# Patient Record
Sex: Female | Born: 1990 | Hispanic: Yes | Smoking: Never smoker
Health system: Southern US, Community
[De-identification: ages and names within clinical notes are randomized; demographics above are authoritative.]

## PROBLEM LIST (undated history)

## (undated) DIAGNOSIS — N189 Chronic kidney disease, unspecified: Secondary | ICD-10-CM

## (undated) DIAGNOSIS — Z789 Other specified health status: Secondary | ICD-10-CM

## (undated) DIAGNOSIS — I10 Essential (primary) hypertension: Secondary | ICD-10-CM

## (undated) HISTORY — DX: Other specified health status: Z78.9

## (undated) HISTORY — DX: Essential (primary) hypertension: I10

## (undated) HISTORY — DX: Chronic kidney disease, unspecified: N18.9

## (undated) HISTORY — PX: NO PAST SURGERIES: SHX2092

---

## 2021-02-16 ENCOUNTER — Emergency Department: Payer: Self-pay

## 2021-02-16 ENCOUNTER — Encounter: Payer: Self-pay | Admitting: Emergency Medicine

## 2021-02-16 ENCOUNTER — Other Ambulatory Visit: Payer: Self-pay

## 2021-02-16 ENCOUNTER — Emergency Department
Admission: EM | Admit: 2021-02-16 | Discharge: 2021-02-16 | Disposition: A | Discharge status: Home / Self Care | Payer: Self-pay | Attending: Emergency Medicine | Admitting: Emergency Medicine

## 2021-02-16 DIAGNOSIS — N760 Acute vaginitis: Secondary | ICD-10-CM | POA: Insufficient documentation

## 2021-02-16 DIAGNOSIS — R102 Pelvic and perineal pain: Secondary | ICD-10-CM

## 2021-02-16 DIAGNOSIS — N939 Abnormal uterine and vaginal bleeding, unspecified: Secondary | ICD-10-CM | POA: Insufficient documentation

## 2021-02-16 DIAGNOSIS — B9689 Other specified bacterial agents as the cause of diseases classified elsewhere: Secondary | ICD-10-CM

## 2021-02-16 LAB — WET PREP, GENITAL
Sperm: NONE SEEN
Trich, Wet Prep: NONE SEEN
Yeast Wet Prep HPF POC: NONE SEEN

## 2021-02-16 LAB — CHLAMYDIA/NGC RT PCR (ARMC ONLY)
Chlamydia Tr: NOT DETECTED
N gonorrhoeae: NOT DETECTED

## 2021-02-16 LAB — PREGNANCY, URINE: Preg Test, Ur: NEGATIVE

## 2021-02-16 MED ORDER — METRONIDAZOLE 500 MG PO TABS: 500.0000 mg | ORAL_TABLET | Freq: Two times a day (BID) | ORAL | 0 refills | Status: AC

## 2021-02-16 MED ORDER — METOCLOPRAMIDE HCL 5 MG PO TABS: 5.0000 mg | ORAL_TABLET | Freq: Three times a day (TID) | ORAL | 0 refills | Status: DC | PRN

## 2021-02-16 MED ORDER — METRONIDAZOLE 500 MG PO TABS
500.0000 mg | ORAL_TABLET | Freq: Once | ORAL | Status: AC
  Administered 2021-02-16: 500 mg via ORAL
  Filled 2021-02-16: qty 1

## 2021-02-16 MED ORDER — KETOROLAC TROMETHAMINE 30 MG/ML IJ SOLN
30.0000 mg | Freq: Once | INTRAMUSCULAR | Status: AC
  Administered 2021-02-16: 30 mg via INTRAMUSCULAR
  Filled 2021-02-16: qty 1

## 2021-02-16 MED ORDER — IBUPROFEN 800 MG PO TABS: 800.0000 mg | ORAL_TABLET | Freq: Three times a day (TID) | ORAL | 0 refills | Status: DC | PRN

## 2021-02-16 NOTE — ED Notes (Signed)
Pt has been complaining of vaginal bleeding for the past three days. Pt states that she also having pain in lower abd.

## 2021-02-16 NOTE — Discharge Instructions (Signed)
Your exam, labs, and ultrasound are all basically normal.  There is no indication of any serious cause of your pelvic pain including ovarian cyst, endometriosis, or fibroid tumors.  Your bleeding is due to the hormonal pill you took a few days earlier.  You expect to have continued bleeding for the next week.  Take the prescription medications as prescribed.  Follow-up with [place] health department or [facility] for ongoing symptoms peer return to the ED if needed.

## 2021-02-16 NOTE — ED Provider Notes (Signed)
[facility] Emergency Department Provider Note ____________________________________________  Time seen: 1811  I have reviewed the triage vital signs and the nursing notes.  HISTORY  Chief Complaint  Vaginal Bleeding   HPI Sherri Vasquez is a 30 y.o. female presents her self to the ED for evaluation of a 3-day complaint of vaginal bleeding. Patient reports he had a normal menstrual period on 2/14 - 02/10/2020. She describes an unprotected sexual encounter on Sunday, 2/20. She took the Plan B pill on Sunday, and began to experience heavy vaginal bleeding 3 days prior on 2/23. She reports severe menstrual cramps as well as large blood clots at this time. She has not taken any medicines for pain relief in the interim. She denies any fevers, chills, or sweats. She also denies any nausea, vomiting, diarrhea.  History reviewed. No pertinent past medical history.  There are no problems to display for this patient.   History reviewed. No pertinent surgical history.  Prior to Admission medications   Medication Sig Start Date End Date Taking? Authorizing Provider  ibuprofen (ADVIL) 800 MG tablet Take 1 tablet (800 mg total) by mouth every 8 (eight) hours as needed. 02/16/21  Yes Menshew, Charlesetta Ivory, PA-C  metoCLOPramide (REGLAN) 5 MG tablet Take 1 tablet (5 mg total) by mouth every 8 (eight) hours as needed for nausea or vomiting. 02/16/21  Yes Menshew, Charlesetta Ivory, PA-C  metroNIDAZOLE (FLAGYL) 500 MG tablet Take 1 tablet (500 mg total) by mouth 2 (two) times daily for 7 days. 02/16/21 02/23/21 Yes Menshew, Charlesetta Ivory, PA-C    Allergies Patient has no known allergies.  History reviewed. No pertinent family history.  Social History Social History   Tobacco Use  . Smoking status: Never Smoker  . Smokeless tobacco: Never Used  Substance Use Topics  . Alcohol use: Not Currently  . Drug use: Not Currently    Review of Systems  Constitutional: Negative  for fever. Eyes: Negative for visual changes. ENT: Negative for sore throat. Cardiovascular: Negative for chest pain. Respiratory: Negative for shortness of breath. Gastrointestinal: Negative for abdominal pain, vomiting and diarrhea. Genitourinary: Negative for dysuria. Reports menstrual cramps and vaginal bleeding as above. Musculoskeletal: Negative for back pain. Skin: Negative for rash. Neurological: Negative for headaches, focal weakness or numbness. ____________________________________________  PHYSICAL EXAM:  VITAL SIGNS: ED Triage Vitals [02/16/21 1710]  Enc Vitals Group     BP 130/87     Pulse Rate 76     Resp 18     Temp 98.7 F (37.1 C)     Temp Source Oral     SpO2 98 %     Weight 135 lb (61.2 kg)     Height 5\' 1"  (1.549 m)     Head Circumference      Peak Flow      Pain Score 9     Pain Loc      Pain Edu?      Excl. in GC?     Constitutional: Alert and oriented. Well appearing and in no distress. Head: Normocephalic and atraumatic. Eyes: Conjunctivae are normal. Normal extraocular movements Mouth/Throat: Mucous membranes are moist. Cardiovascular: Normal rate, regular rhythm. Normal distal pulses. Respiratory: Normal respiratory effort. No wheezes/rales/rhonchi. Gastrointestinal: Soft and nontender. No distention. GU: Normal external genitalia.  Dark red blood noted in the vault.  No cervical motion tenderness or adnexal masses appreciated. Musculoskeletal: Nontender with normal range of motion in all extremities.  Neurologic:  Normal gait without ataxia.  Normal speech and language. No gross focal neurologic deficits are appreciated. Skin:  Skin is warm, dry and intact. No rash noted. Psychiatric: Mood and affect are normal. Patient exhibits appropriate insight and judgment. ____________________________________________   LABS (pertinent positives/negatives) Labs Reviewed  WET PREP, GENITAL - Abnormal; Notable for the following components:      Result  Value   Clue Cells Wet Prep HPF POC PRESENT (*)    WBC, Wet Prep HPF POC FEW (*)    All other components within normal limits  CHLAMYDIA/NGC RT PCR ([facility] ONLY)  PREGNANCY, URINE  ____________________________________________   RADIOLOGY  US Pelvic Complete w/ Transvaginal r/o Torsion  IMPRESSION: Normal pelvic ultrasound. No evidence of adnexal mass or ovarian torsion. The endometrium appears normal. ____________________________________________  PROCEDURES  Toradol 30 mg IM Metronidazole 500 mg p.o.  Procedures ____________________________________________  INITIAL IMPRESSION / ASSESSMENT AND PLAN / ED COURSE  Differential diagnosis includes, but is not limited to, ovarian cyst, ovarian torsion, acute appendicitis, diverticulitis, urinary tract infection/pyelonephritis, endometriosis, bowel obstruction, colitis, renal colic, gastroenteritis, hernia, fibroids, pregnancy related pain including ectopic pregnancy, etc.  Female patient with ED evaluation of 3 days of vaginal bleeding and cramping.  Patient admittedly took a Plan B tablet the same day as and on protected sexual encounter.  She began experience bleeding and cramping 3 days later.  Her exam is overall benign reassurance time.  Concern was for possible ovarian cyst or torsion, complicating her picture.  She was found to have a normal pelvic ultrasound exam.  Her wet prep did reveal clue cells and she will be treated empirically with metronidazole.  Patient clinical picture at this point likely is consistent with vaginal bleeding and cramping secondary to her emergency contraception.  Patient is advised that she may experience bleeding for another week.  She is to take the prescription ibuprofen as directed.  She is also to take metronidazole as as prescribed.  She will follow with primary provider, local urgent care, or an OB provider for ongoing symptoms.  Return precautions have been discussed.   Sherri Vasquez  was evaluated in Emergency Department on 02/16/2021 for the symptoms described in the history of present illness. She was evaluated in the context of the global COVID-19 pandemic, which necessitated consideration that the patient might be at risk for infection with the SARS-CoV-2 virus that causes COVID-19. Institutional protocols and algorithms that pertain to the evaluation of patients at risk for COVID-19 are in a state of rapid change based on information released by regulatory bodies including the CDC and federal and state organizations. These policies and algorithms were followed during the patient's care in the ED. ____________________________________________  FINAL CLINICAL IMPRESSION(S) / ED DIAGNOSES  Final diagnoses:  Abnormal vaginal bleeding  Pelvic pain  BV (bacterial vaginosis)      Karmen Stabs, Charlesetta Ivory, PA-C 02/16/21 2116    Phineas Semen, MD 02/16/21 2150

## 2021-02-16 NOTE — ED Notes (Signed)
U bag placed on this patient at this time

## 2021-02-16 NOTE — ED Triage Notes (Signed)
Pt comes into the ED via POV c/o vaginal bleeding.  Pt states she had her menstruation on 02/06/20 it stopped, she had intercourse, took the plan B pill, and now she has started bleeding again that started 3 days ago.  Pt states she is passing blood clots and has severe cramps.  Pt ambulatory to triage and in NAD at this time.

## 2021-03-05 ENCOUNTER — Other Ambulatory Visit: Payer: Self-pay

## 2021-03-05 ENCOUNTER — Encounter: Payer: Self-pay | Admitting: Radiology

## 2021-03-05 ENCOUNTER — Emergency Department: Payer: Self-pay

## 2021-03-05 ENCOUNTER — Emergency Department
Admission: EM | Admit: 2021-03-05 | Discharge: 2021-03-05 | Disposition: A | Discharge status: Home / Self Care | Payer: Self-pay | Attending: Emergency Medicine | Admitting: Emergency Medicine

## 2021-03-05 DIAGNOSIS — W19XXXA Unspecified fall, initial encounter: Secondary | ICD-10-CM | POA: Insufficient documentation

## 2021-03-05 DIAGNOSIS — S82832A Other fracture of upper and lower end of left fibula, initial encounter for closed fracture: Secondary | ICD-10-CM | POA: Insufficient documentation

## 2021-03-05 DIAGNOSIS — M25572 Pain in left ankle and joints of left foot: Secondary | ICD-10-CM

## 2021-03-05 DIAGNOSIS — Y9366 Activity, soccer: Secondary | ICD-10-CM | POA: Insufficient documentation

## 2021-03-05 MED ORDER — HYDROCODONE-ACETAMINOPHEN 5-325 MG PO TABS: 1.0000 | ORAL_TABLET | ORAL | 0 refills | Status: DC | PRN

## 2021-03-05 MED ORDER — OXYCODONE-ACETAMINOPHEN 5-325 MG PO TABS
1.0000 | ORAL_TABLET | ORAL | Status: AC | PRN
  Administered 2021-03-05 (×2): 1 via ORAL
  Filled 2021-03-05 (×2): qty 1

## 2021-03-05 NOTE — ED Provider Notes (Signed)
[facility] Emergency Department Provider Note   ____________________________________________   Event Date/Time   First MD Initiated Contact with Patient 03/05/21 [phone]     (approximate)  I have reviewed the triage vital signs and the nursing notes.   HISTORY  Chief Complaint Ankle Injury    HPI Sherri Vasquez is a 30 y.o. female with no stated past medical history presents after an injury to the left ankle yesterday while playing soccer.  Patient states that she fell onto the lateral aspect of her left ankle and has had 9/10, aching/throbbing pain that radiates up the lateral aspect of her left leg.  Patient states that ambulation and palpation over the lateral aspect of the ankle increases this pain.  Patient denies any specific alleviating factors however she states that with it elevated the pain is somewhat relieved.  Patient denies any other injuries, loss of consciousness, or decreased range of motion at this joint         History reviewed. No pertinent past medical history.  There are no problems to display for this patient.   No past surgical history on file.  Prior to Admission medications   Medication Sig Start Date End Date Taking? Authorizing Provider  HYDROcodone-acetaminophen (NORCO) 5-325 MG tablet Take 1 tablet by mouth every 4 (four) hours as needed for moderate pain. 03/05/21  Yes Merwyn Katos, MD  ibuprofen (ADVIL) 800 MG tablet Take 1 tablet (800 mg total) by mouth every 8 (eight) hours as needed. 02/16/21   Menshew, Charlesetta Ivory, PA-C  metoCLOPramide (REGLAN) 5 MG tablet Take 1 tablet (5 mg total) by mouth every 8 (eight) hours as needed for nausea or vomiting. 02/16/21   Menshew, Charlesetta Ivory, PA-C    Allergies Patient has no known allergies.  No family history on file.  Social History Social History   Tobacco Use  . Smoking status: Never Smoker  . Smokeless tobacco: Never Used  Substance Use Topics  .  Alcohol use: Not Currently  . Drug use: Not Currently    Review of Systems Constitutional: No fever/chills Eyes: No visual changes. ENT: No sore throat. Cardiovascular: Denies chest pain. Respiratory: Denies shortness of breath. Gastrointestinal: No abdominal pain.  No nausea, no vomiting.  No diarrhea. Genitourinary: Negative for dysuria. Musculoskeletal: Endorses acute left ankle pain Skin: Negative for rash. Neurological: Negative for headaches, weakness/numbness/paresthesias in any extremity Psychiatric: Negative for suicidal ideation/homicidal ideation   ____________________________________________   PHYSICAL EXAM:  VITAL SIGNS: ED Triage Vitals  Enc Vitals Group     BP 03/05/21 0211 (!) 126/91     Pulse Rate 03/05/21 0211 81     Resp 03/05/21 0211 16     Temp 03/05/21 0211 97.9 F (36.6 C)     Temp Source 03/05/21 0211 Oral     SpO2 03/05/21 0211 98 %     Weight 03/05/21 0208 137 lb (62.1 kg)     Height 03/05/21 0208 5\' 1"  (1.549 m)     Head Circumference --      Peak Flow --      Pain Score 03/05/21 0208 10     Pain Loc --      Pain Edu? --      Excl. in GC? --    Constitutional: Alert and oriented. Well appearing and in no acute distress. Eyes: Conjunctivae are normal. PERRL. Head: Atraumatic. Nose: No congestion/rhinnorhea. Mouth/Throat: Mucous membranes are moist. Neck: No stridor Cardiovascular: Grossly normal heart sounds.  Good peripheral circulation. Respiratory: Normal respiratory effort.  No retractions. Gastrointestinal: Soft and nontender. No distention. Musculoskeletal: Swelling and tenderness to palpation in the left ankle with limited range of motion secondary to pain Neurologic:  Normal speech and language. No gross focal neurologic deficits are appreciated. Skin:  Skin is warm and dry. No rash noted. Psychiatric: Mood and affect are normal. Speech and behavior are normal.  ____________________________________________   LABS (all labs  ordered are listed, but only abnormal results are displayed)  Labs Reviewed - No data to display ____________________________________________  EKG  RADIOLOGY  ED MD interpretation: Three-view x-ray of the left ankle shows a nondisplaced distal fibular fracture  Official radiology report(s): DG Ankle Complete Left  Result Date: 03/05/2021 CLINICAL DATA:  Ankle injury EXAM: LEFT ANKLE COMPLETE - 3+ VIEW COMPARISON:  None. FINDINGS: Acute nondisplaced fracture involves the distal shaft and metaphysis of the fibula. Moderate soft tissue swelling. Minimal widening medial mortise. IMPRESSION: Acute nondisplaced distal fibular fracture. Electronically Signed   By: Jasmine Pang M.D.   On: 03/05/2021 03:23    ____________________________________________   PROCEDURES  Procedure(s) performed (including Critical Care):  Procedures   ____________________________________________   INITIAL IMPRESSION / ASSESSMENT AND PLAN / ED COURSE  As part of my medical decision making, I reviewed the following data within the electronic MEDICAL RECORD NUMBER Nursing notes reviewed and incorporated, Old chart reviewed, Radiograph reviewed and Notes from prior ED visits reviewed and incorporated      Workup: XR Ankle Findings: Distal left fibular fracture  Patient does not currently demonstrate complications of fracture such as compartment syndrome, arterial or nerve injury. Interventions: The fracture has been satisfactorily immobilized, and the patient has been given appropriate analgesia.  Disposition: Discharge with strict return precautions and instructions to follow up with primary MD within 24-48 hours for further evaluation including referral to an orthopedist or podiatrist.      ____________________________________________   FINAL CLINICAL IMPRESSION(S) / ED DIAGNOSES  Final diagnoses:  Other closed fracture of distal end of left fibula, initial encounter  Acute left ankle pain      ED Discharge Orders         Ordered    HYDROcodone-acetaminophen (NORCO) 5-325 MG tablet  Every 4 hours PRN        03/05/21 0404           Note:  This document was prepared using Dragon voice recognition software and may include unintentional dictation errors.   Merwyn Katos, MD 03/05/21 [phone]

## 2021-03-05 NOTE — ED Notes (Signed)
Pt agreeable with d/c plan as discussed by Dr Vicente Males- this nurse has verbally reinforced d/c instructions and provided pt with written copy - strongly educated pt on importance of following up with ortho to maximize healing - pt acknowledges verbal understanding and denies any additional questions, concerns, needs.   Escorted to lobby to await ride (brother will be taking pt home) -- lobby staff first nurse April, RN made aware of pt status/location

## 2021-03-05 NOTE — ED Triage Notes (Addendum)
Pt states was playing soccer earlier yesterday when she injured left ankle. Pt drove self to ed and was escorted from parking lot by this rn into wheelchair. Pt states pain is severe. Able to move toes.

## 2021-03-07 NOTE — ED Notes (Signed)
[facility] and [facility] not [facility] norco.  So she has not gotten her med yet.  I called the [facility] and at first they said they did not have any. They are unable to transfer the rx to [facility] either.  They then said that the rx was for only 6 pills and they think they have 4.  She checked and she has 4, so she can fill the med for 4 pills now.  She is going to contact the patient and inform her.

## 2021-04-04 ENCOUNTER — Other Ambulatory Visit: Payer: Self-pay

## 2021-04-04 ENCOUNTER — Ambulatory Visit (LOCAL_COMMUNITY_HEALTH_CENTER): Payer: Self-pay

## 2021-04-04 VITALS — BP 130/86 | Ht 61.0 in | Wt 140.5 lb

## 2021-04-04 DIAGNOSIS — Z3201 Encounter for pregnancy test, result positive: Secondary | ICD-10-CM

## 2021-04-04 MED ORDER — PRENATAL 27-0.8 MG PO TABS: 1.0000 | ORAL_TABLET | Freq: Every day | ORAL | 0 refills | Status: AC

## 2021-04-04 NOTE — Progress Notes (Addendum)
UPT positive. Plans prenatal care at [facility]. LMP 02/05/2021. No [facility] on file. To clerk for preadmit. Jerel Shepherd, RN

## 2021-04-05 LAB — PREGNANCY, URINE: Preg Test, Ur: POSITIVE — AB

## 2021-05-01 ENCOUNTER — Other Ambulatory Visit: Payer: Self-pay

## 2021-05-01 ENCOUNTER — Encounter: Payer: Self-pay | Admitting: Advanced Practice Midwife

## 2021-05-01 ENCOUNTER — Ambulatory Visit: Payer: Medicaid Other | Admitting: Advanced Practice Midwife

## 2021-05-01 DIAGNOSIS — O0991 Supervision of high risk pregnancy, unspecified, first trimester: Secondary | ICD-10-CM | POA: Diagnosis not present

## 2021-05-01 DIAGNOSIS — Z3403 Encounter for supervision of normal first pregnancy, third trimester: Secondary | ICD-10-CM | POA: Insufficient documentation

## 2021-05-01 DIAGNOSIS — Z641 Problems related to multiparity: Secondary | ICD-10-CM

## 2021-05-01 DIAGNOSIS — Z23 Encounter for immunization: Secondary | ICD-10-CM

## 2021-05-01 DIAGNOSIS — Z789 Other specified health status: Secondary | ICD-10-CM

## 2021-05-01 DIAGNOSIS — O161 Unspecified maternal hypertension, first trimester: Secondary | ICD-10-CM | POA: Diagnosis not present

## 2021-05-01 LAB — URINALYSIS
Bilirubin, UA: NEGATIVE
Glucose, UA: NEGATIVE
Ketones, UA: NEGATIVE
Nitrite, UA: POSITIVE — AB
Protein,UA: NEGATIVE
Specific Gravity, UA: 1.025 (ref 1.005–1.030)
Urobilinogen, Ur: 0.2 mg/dL (ref 0.2–1.0)
pH, UA: 6 (ref 5.0–7.5)

## 2021-05-01 LAB — WET PREP FOR TRICH, YEAST, CLUE
Trichomonas Exam: NEGATIVE
Yeast Exam: NEGATIVE

## 2021-05-01 LAB — HEMOGLOBIN, FINGERSTICK: Hemoglobin: 12.5 g/dL (ref 11.1–15.9)

## 2021-05-01 LAB — PREGNANCY, URINE: Preg Test, Ur: POSITIVE — AB
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
## 2021-05-01 NOTE — Progress Notes (Addendum)
Here today for 12.1 week MH IP. Taking PNV QD. Denies ED/hospital visits since +PT.  Relocated to [place] from [place] 08/2020. Accepts Flu vaccine. Tawny Hopping, RN

## 2021-05-01 NOTE — Progress Notes (Addendum)
UA, Hgb and [employer] results reviewed. Per standing orders no treatment indicated. RN informed patient she was + for urine nitrates and may have a UTI but will verify with pending urine C&S results. Patient verbalized understanding. Tawny Hopping, RN

## 2021-05-02 ENCOUNTER — Other Ambulatory Visit: Payer: Self-pay

## 2021-05-02 ENCOUNTER — Inpatient Hospital Stay
Admission: EM | Admit: 2021-05-02 | Discharge: 2021-05-06 | DRG: 832 | Disposition: A | Discharge status: Home / Self Care | Payer: Medicaid Other | Attending: Family Medicine | Admitting: Family Medicine

## 2021-05-02 ENCOUNTER — Encounter: Payer: Self-pay | Admitting: Emergency Medicine

## 2021-05-02 ENCOUNTER — Telehealth: Payer: Self-pay

## 2021-05-02 ENCOUNTER — Emergency Department: Payer: Medicaid Other

## 2021-05-02 DIAGNOSIS — O26893 Other specified pregnancy related conditions, third trimester: Secondary | ICD-10-CM | POA: Diagnosis present

## 2021-05-02 DIAGNOSIS — O26891 Other specified pregnancy related conditions, first trimester: Secondary | ICD-10-CM

## 2021-05-02 DIAGNOSIS — O26831 Pregnancy related renal disease, first trimester: Secondary | ICD-10-CM | POA: Diagnosis present

## 2021-05-02 DIAGNOSIS — R109 Unspecified abdominal pain: Secondary | ICD-10-CM

## 2021-05-02 DIAGNOSIS — Z3A12 12 weeks gestation of pregnancy: Secondary | ICD-10-CM | POA: Diagnosis not present

## 2021-05-02 DIAGNOSIS — O2301 Infections of kidney in pregnancy, first trimester: Principal | ICD-10-CM | POA: Diagnosis present

## 2021-05-02 DIAGNOSIS — R030 Elevated blood-pressure reading, without diagnosis of hypertension: Secondary | ICD-10-CM | POA: Diagnosis present

## 2021-05-02 DIAGNOSIS — N10 Acute tubulo-interstitial nephritis: Secondary | ICD-10-CM | POA: Diagnosis present

## 2021-05-02 DIAGNOSIS — O99411 Diseases of the circulatory system complicating pregnancy, first trimester: Secondary | ICD-10-CM | POA: Diagnosis present

## 2021-05-02 DIAGNOSIS — Z20822 Contact with and (suspected) exposure to covid-19: Secondary | ICD-10-CM | POA: Diagnosis present

## 2021-05-02 DIAGNOSIS — O0941 Supervision of pregnancy with grand multiparity, first trimester: Secondary | ICD-10-CM

## 2021-05-02 DIAGNOSIS — R000 Tachycardia, unspecified: Secondary | ICD-10-CM | POA: Diagnosis present

## 2021-05-02 DIAGNOSIS — N200 Calculus of kidney: Secondary | ICD-10-CM

## 2021-05-02 DIAGNOSIS — B9620 Unspecified Escherichia coli [E. coli] as the cause of diseases classified elsewhere: Secondary | ICD-10-CM | POA: Diagnosis present

## 2021-05-02 DIAGNOSIS — R1032 Left lower quadrant pain: Secondary | ICD-10-CM | POA: Diagnosis present

## 2021-05-02 DIAGNOSIS — Z3491 Encounter for supervision of normal pregnancy, unspecified, first trimester: Secondary | ICD-10-CM

## 2021-05-02 LAB — BASIC METABOLIC PANEL
Anion gap: 10 (ref 5–15)
BUN: 6 mg/dL (ref 6–20)
CO2: 21 mmol/L — ABNORMAL LOW (ref 22–32)
Calcium: 9.3 mg/dL (ref 8.9–10.3)
Chloride: 105 mmol/L (ref 98–111)
Creatinine, Ser: 0.55 mg/dL (ref 0.44–1.00)
GFR, Estimated: >60 mL/min (ref >60)
Glucose, Bld: 117 mg/dL — ABNORMAL HIGH (ref 70–99)
Potassium: 3.5 mmol/L (ref 3.5–5.1)
Sodium: 136 mmol/L (ref 135–145)

## 2021-05-02 LAB — CBC
HCT: 36.1 % (ref 36.0–46.0)
Hemoglobin: 12.6 g/dL (ref 12.0–15.0)
MCH: 30.5 pg (ref 26.0–34.0)
MCHC: 34.9 g/dL (ref 30.0–36.0)
MCV: 87.4 fL (ref 80.0–100.0)
Platelets: 296 10*3/uL (ref 150–400)
RBC: 4.13 MIL/uL (ref 3.87–5.11)
RDW: 12.5 % (ref 11.5–15.5)
WBC: 11.4 10*3/uL — ABNORMAL HIGH (ref 4.0–10.5)
nRBC: 0 % (ref 0.0–0.2)

## 2021-05-02 LAB — URINALYSIS, COMPLETE (UACMP) WITH MICROSCOPIC
Bilirubin Urine: NEGATIVE
Glucose, UA: NEGATIVE mg/dL
Ketones, ur: 5 mg/dL — AB
Nitrite: NEGATIVE
Protein, ur: 30 mg/dL — AB
RBC / HPF: >50 RBC/hpf — ABNORMAL HIGH (ref 0–5)
Specific Gravity, Urine: 1.012 (ref 1.005–1.030)
WBC, UA: >50 WBC/hpf — ABNORMAL HIGH (ref 0–5)
pH: 6 (ref 5.0–8.0)

## 2021-05-02 LAB — CHLAMYDIA/NGC RT PCR (ARMC ONLY)
Chlamydia Tr: NOT DETECTED
N gonorrhoeae: NOT DETECTED

## 2021-05-02 LAB — POC URINE PREG, ED: Preg Test, Ur: POSITIVE — AB

## 2021-05-02 LAB — RESP PANEL BY RT-PCR (FLU A&B, COVID) ARPGX2
Influenza A by PCR: NEGATIVE
Influenza B by PCR: NEGATIVE
SARS Coronavirus 2 by RT PCR: NEGATIVE

## 2021-05-02 LAB — WET PREP, GENITAL
Clue Cells Wet Prep HPF POC: NONE SEEN
Sperm: NONE SEEN
Trich, Wet Prep: NONE SEEN
Yeast Wet Prep HPF POC: NONE SEEN

## 2021-05-02 LAB — HCG, QUANTITATIVE, PREGNANCY: hCG, Beta Chain, Quant, S: 92367 m[IU]/mL — ABNORMAL HIGH (ref <5)

## 2021-05-02 LAB — ABO/RH: ABO/RH(D): O POS

## 2021-05-02 LAB — LEAD, BLOOD (ADULT >= 16 YRS): Lead-Whole Blood: <1 ug/dL (ref 0–4)

## 2021-05-02 LAB — HGB A1C W/O EAG: Hgb A1c MFr Bld: 5.3 % (ref 4.8–5.6)

## 2021-05-02 MED ORDER — MORPHINE SULFATE (PF) 2 MG/ML IV SOLN
2.0000 mg | INTRAVENOUS | Status: DC | PRN
  Administered 2021-05-02 – 2021-05-03 (×4): 2 mg via INTRAVENOUS
  Filled 2021-05-02 (×4): qty 1

## 2021-05-02 MED ORDER — ONDANSETRON HCL 4 MG/2ML IJ SOLN
4.0000 mg | Freq: Once | INTRAMUSCULAR | Status: AC
  Administered 2021-05-02: 4 mg via INTRAVENOUS
  Filled 2021-05-02: qty 2

## 2021-05-02 MED ORDER — HYDROMORPHONE HCL 1 MG/ML IJ SOLN
INTRAMUSCULAR | Status: AC
  Filled 2021-05-02: qty 1

## 2021-05-02 MED ORDER — ONDANSETRON HCL 4 MG PO TABS
4.0000 mg | ORAL_TABLET | Freq: Four times a day (QID) | ORAL | Status: DC | PRN
  Administered 2021-05-03: 4 mg via ORAL
  Filled 2021-05-02: qty 1

## 2021-05-02 MED ORDER — SODIUM CHLORIDE 0.9 % IV BOLUS
1000.0000 mL | Freq: Once | INTRAVENOUS | Status: AC
  Administered 2021-05-02: 1000 mL via INTRAVENOUS

## 2021-05-02 MED ORDER — HYDROMORPHONE HCL 1 MG/ML IJ SOLN
0.5000 mg | Freq: Once | INTRAMUSCULAR | Status: AC
  Administered 2021-05-02: 0.5 mg via INTRAVENOUS

## 2021-05-02 MED ORDER — ACETAMINOPHEN 325 MG PO TABS
650.0000 mg | ORAL_TABLET | Freq: Four times a day (QID) | ORAL | Status: DC | PRN
  Administered 2021-05-02 – 2021-05-05 (×6): 650 mg via ORAL
  Filled 2021-05-02 (×6): qty 2

## 2021-05-02 MED ORDER — MORPHINE SULFATE (PF) 4 MG/ML IV SOLN
4.0000 mg | Freq: Once | INTRAVENOUS | Status: AC
  Administered 2021-05-02: 4 mg via INTRAVENOUS
  Filled 2021-05-02: qty 1

## 2021-05-02 MED ORDER — ACETAMINOPHEN 650 MG RE SUPP: 650.0000 mg | Freq: Four times a day (QID) | RECTAL | Status: DC | PRN

## 2021-05-02 MED ORDER — ONDANSETRON HCL 4 MG/2ML IJ SOLN
4.0000 mg | Freq: Four times a day (QID) | INTRAMUSCULAR | Status: DC | PRN
  Administered 2021-05-03 – 2021-05-04 (×3): 4 mg via INTRAVENOUS
  Filled 2021-05-02 (×3): qty 2

## 2021-05-02 MED ORDER — SODIUM CHLORIDE 0.9 % IV SOLN
1.0000 g | Freq: Once | INTRAVENOUS | Status: AC
  Administered 2021-05-02: 1 g via INTRAVENOUS
  Filled 2021-05-02: qty 10

## 2021-05-02 NOTE — ED Notes (Signed)
Patient transported to Ultrasound 

## 2021-05-02 NOTE — ED Provider Notes (Signed)
[facility] Emergency Department Provider Note   ____________________________________________   Event Date/Time   First MD Initiated Contact with Patient 05/02/21 1636     (approximate)  I have reviewed the triage vital signs and the nursing notes.   HISTORY  Chief Complaint Flank Pain    HPI Sherri Vasquez is a 30 y.o. female, G7P6006 at approximately 12 weeks of pregnancy presents to the ED complaining of flank pain.  Patient reports that she developed some pain in the left lower quadrant of her abdomen yesterday.  Pain has been waxing and waning but constant since onset, described as sharp and severe at this moment.  It has been associated with nausea and a couple episodes of vomiting, she denies any changes in her bowel movements.  She has not noticed any dysuria or hematuria, denies any fevers.  She describes current symptoms as similar to prior kidney stones.  She recently had a positive pregnancy test at her PCPs office 2 days ago, states her LMP was February 12th.  She has not yet had an ultrasound this pregnancy or established with OB/GYN.  She had some light vaginal spotting 3 days ago that has since resolved and she denies any pelvic pain or discharge.        Past Medical History:  Diagnosis Date  . Chronic kidney disease    2019 kidney stones during preg  . Hypertension   . Patient denies medical problems     Patient Active Problem List   Diagnosis Date Noted  . Acute pyelonephritis 05/02/2021  . Nephrolithiasis 05/02/2021  . First trimester pregnancy 05/02/2021  . Low birth weight infant x2 at term (06/09/2015 5 lbs, 03/24/2011 5 lbs) 05/01/2021  . Grand multipara G7P6 05/01/2021  . Supervision of high risk pregnancy in first trimester 05/01/2021  . Elevated blood pressure affecting pregnancy in first trimester, antepartum 05/01/2021  . Poor historian 05/01/2021    Past Surgical History:  Procedure Laterality Date  . NO PAST  SURGERIES      Prior to Admission medications   Medication Sig Start Date End Date Taking? Authorizing Provider  HYDROcodone-acetaminophen (NORCO) 5-325 MG tablet Take 1 tablet by mouth every 4 (four) hours as needed for moderate pain. Patient not taking: Reported on 04/04/2021 03/05/21   Merwyn Katos, MD  ibuprofen (ADVIL) 800 MG tablet Take 1 tablet (800 mg total) by mouth every 8 (eight) hours as needed. Patient not taking: Reported on 04/04/2021 02/16/21   Menshew, Charlesetta Ivory, PA-C  metoCLOPramide (REGLAN) 5 MG tablet Take 1 tablet (5 mg total) by mouth every 8 (eight) hours as needed for nausea or vomiting. Patient not taking: Reported on 04/04/2021 02/16/21   Menshew, Charlesetta Ivory, PA-C  Prenatal Vit-Fe Fumarate-FA (MULTIVITAMIN-PRENATAL) 27-0.8 MG TABS tablet Take 1 tablet by mouth daily at 12 noon. 04/04/21 07/13/21  Federico Flake, MD    Allergies Patient has no known allergies.  Family History  Problem Relation Age of Onset  . Healthy Mother   . Healthy Father     Social History Social History   Tobacco Use  . Smoking status: Never Smoker  . Smokeless tobacco: Never Used  . Tobacco comment: Denies secondhand smoke exposure  Vaping Use  . Vaping Use: Never used  Substance Use Topics  . Alcohol use: Not Currently    Comment: last use- 2 mo ago  . Drug use: Never    Review of Systems  Constitutional: No fever/chills Eyes: No visual  changes. ENT: No sore throat. Cardiovascular: Denies chest pain. Respiratory: Denies shortness of breath. Gastrointestinal: Positive for flank and abdominal pain.  Positive for nausea and vomiting.  No diarrhea.  No constipation. Genitourinary: Negative for dysuria.  Positive for vaginal bleeding. Musculoskeletal: Negative for back pain. Skin: Negative for rash. Neurological: Negative for headaches, focal weakness or numbness.  ____________________________________________   PHYSICAL EXAM:  VITAL SIGNS: ED Triage  Vitals  Enc Vitals Group     BP 05/02/21 1612 (!) 154/123     Pulse Rate 05/02/21 1612 100     Resp 05/02/21 1612 16     Temp 05/02/21 1612 98.1 F (36.7 C)     Temp Source 05/02/21 1612 Oral     SpO2 05/02/21 1612 99 %     Weight 05/02/21 1614 137 lb (62.1 kg)     Height 05/02/21 1614 5\' 1"  (1.549 m)     Head Circumference --      Peak Flow --      Pain Score 05/02/21 1614 10     Pain Loc --      Pain Edu? --      Excl. in GC? --     Constitutional: Alert and oriented. Eyes: Conjunctivae are normal. Head: Atraumatic. Nose: No congestion/rhinnorhea. Mouth/Throat: Mucous membranes are moist. Neck: Normal ROM Cardiovascular: Normal rate, regular rhythm. Grossly normal heart sounds. Respiratory: Normal respiratory effort.  No retractions. Lungs CTAB. Gastrointestinal: Soft and tender to palpation in the left lower quadrant with no rebound or guarding.  Left CVA tenderness noted. No distention. Genitourinary: Thin whitish discharge with no cervical motion adnexal tenderness. Musculoskeletal: No lower extremity tenderness nor edema. Neurologic:  Normal speech and language. No gross focal neurologic deficits are appreciated. Skin:  Skin is warm, dry and intact. No rash noted. Psychiatric: Mood and affect are normal. Speech and behavior are normal.  ____________________________________________   LABS (all labs ordered are listed, but only abnormal results are displayed)  Labs Reviewed  WET PREP, GENITAL - Abnormal; Notable for the following components:      Result Value   WBC, Wet Prep HPF POC FEW (*)    All other components within normal limits  URINALYSIS, COMPLETE (UACMP) WITH MICROSCOPIC - Abnormal; Notable for the following components:   Color, Urine YELLOW (*)    APPearance CLOUDY (*)    Hgb urine dipstick LARGE (*)    Ketones, ur 5 (*)    Protein, ur 30 (*)    Leukocytes,Ua MODERATE (*)    RBC / HPF >50 (*)    WBC, UA >50 (*)    Bacteria, UA MANY (*)    All other  components within normal limits  BASIC METABOLIC PANEL - Abnormal; Notable for the following components:   CO2 21 (*)    Glucose, Bld 117 (*)    All other components within normal limits  CBC - Abnormal; Notable for the following components:   WBC 11.4 (*)    All other components within normal limits  HCG, QUANTITATIVE, PREGNANCY - Abnormal; Notable for the following components:   hCG, Beta Chain, Quant, S 92,367 (*)    All other components within normal limits  POC URINE PREG, ED - Abnormal; Notable for the following components:   Preg Test, Ur POSITIVE (*)    All other components within normal limits  URINE CULTURE  CHLAMYDIA/NGC RT PCR ([facility] ONLY)  RESP PANEL BY RT-PCR (FLU A&B, COVID) ARPGX2  ABO/RH    PROCEDURES  Procedure(s) performed (including  Critical Care):  Procedures   ____________________________________________   INITIAL IMPRESSION / ASSESSMENT AND PLAN / ED COURSE       30 year old female, G7P6006 at approximately 12 weeks of pregnancy, presents to the ED complaining of worsening left flank pain with nausea and vomiting starting yesterday.  Symptoms sound most consistent with obstructing nephrolithiasis and we will further assess with renal ultrasound, also check obstetric ultrasound to rule out ectopic pregnancy.  UA is concerning for infection and we will send for culture.  Labs thus far are reassuring, ABO/Rh is pending to determine need for RhoGAM.  Patient is Rh+ and there is no indication for RhoGAM.  Pelvic ultrasound shows intrauterine pregnancy with appropriate fetal heart rate.  Renal ultrasound shows fullness of collecting system but no distinct hydronephrosis.  Findings discussed with Dr. Apolinar Junes of urology and given her CVA tenderness, thought to be most consistent with pyelonephritis.  We will treat with Rocephin and case discussed with hospitalist for admission.  Urology will hold off on stenting for now unless patient were to develop  sepsis.      ____________________________________________   FINAL CLINICAL IMPRESSION(S) / ED DIAGNOSES  Final diagnoses:  Pyelonephritis affecting pregnancy in first trimester  Abdominal pain during pregnancy in first trimester  Left flank pain     ED Discharge Orders    None       Note:  This document was prepared using Dragon voice recognition software and may include unintentional dictation errors.   Chesley Noon, MD 05/02/21 [id number]

## 2021-05-02 NOTE — H&P (Signed)
History and Physical    Sherri Vasquez UJW:[id number] DOB: Apr 24, 1991 DOA: 05/02/2021  PCP: Patient, No Pcp Per (Inactive)   Patient coming from: Home  I [facility] personally briefly [facility]  Chief Complaint: Left flank pain  HPI: Sherri Vasquez is a 30 y.o. female G7P6006 at 2312 weeks gestation with history of nephrolithiasis, hypertension during pregnancy, presenting with a few day history of left lower quadrant pain, initially intermittent but now of increasingly severe intensity and more persistent, it is associated with nausea and vomiting x2 episodes.  From left low back to left lower abdomen and is similar to pain when she passed kidney stone during pregnancy in 2019.  She denies dysuria or change in bowel habits and denies fever or chills.  Denies cough, chest pain or shortness of breath.   Denies vaginal discharge or pelvic pain  ED Course: On arrival, afebrile at 98.1 tachycardic at 100 with BP 154/123 and respirations 16, O2 sat 99% on room air.  Blood work with WBC 11,000 and UA with moderate leukocytes and many bacteria.  Blood work otherwise unremarkable. Imaging: Renal ultrasound: Left kidney appears slightly edematous with fullness of the left renal collecting system.  No ureterectasis.  Question a degree of pyelonephritis.  No perinephric fluid or renal abscess.  Study otherwise unremarkable OB ultrasound: Single live intrauterine gestation with estimated gestational age of [redacted] weeks.  No evidence of chorionic hemorrhage no extrauterine pelvic mass or fluid  Patient started on Rocephin and given Dilaudid for pain control.  Hospitalist consulted for admission.  Review of Systems: As per HPI otherwise all other systems on review of systems negative.    Past Medical History:  Diagnosis Date  . Chronic kidney disease    2019 kidney stones during preg  . Hypertension   . Patient denies medical problems     Past  Surgical History:  Procedure Laterality Date  . NO PAST SURGERIES       reports that she has never smoked. She has never used smokeless tobacco. She reports previous alcohol use. She reports that she does not use drugs.  No Known Allergies  Family History  Problem Relation Age of Onset  . Healthy Mother   . Healthy Father       Prior to Admission medications   Medication Sig Start Date End Date Taking? Authorizing Provider  HYDROcodone-acetaminophen (NORCO) 5-325 MG tablet Take 1 tablet by mouth every 4 (four) hours as needed for moderate pain. Patient not taking: Reported on 04/04/2021 03/05/21   Merwyn KatosBradler, Evan K, MD  ibuprofen (ADVIL) 800 MG tablet Take 1 tablet (800 mg total) by mouth every 8 (eight) hours as needed. Patient not taking: Reported on 04/04/2021 02/16/21   Menshew, Charlesetta IvoryJenise V Bacon, PA-C  metoCLOPramide (REGLAN) 5 MG tablet Take 1 tablet (5 mg total) by mouth every 8 (eight) hours as needed for nausea or vomiting. Patient not taking: Reported on 04/04/2021 02/16/21   Menshew, Charlesetta IvoryJenise V Bacon, PA-C  Prenatal Vit-Fe Fumarate-FA (MULTIVITAMIN-PRENATAL) 27-0.8 MG TABS tablet Take 1 tablet by mouth daily at 12 noon. 04/04/21 07/13/21  Federico FlakeNewton, Kimberly Niles, MD    Physical Exam: Vitals:   05/02/21 1612 05/02/21 1614 05/02/21 1616  BP: (!) 154/123  (S) (!) 155/136  Pulse: 100    Resp: 16    Temp: 98.1 F (36.7 C)    TempSrc: Oral    SpO2: 99%    Weight:  62.1 kg   Height:  5\' 1"  (1.549 m)      Vitals:   05/02/21 1612 05/02/21 1614 05/02/21 1616  BP: (!) 154/123  (S) (!) 155/136  Pulse: 100    Resp: 16    Temp: 98.1 F (36.7 C)    TempSrc: Oral    SpO2: 99%    Weight:  62.1 kg   Height:  5\' 1"  (1.549 m)     Constitutional: Alert and oriented x 3 . Not in any apparent distress HEENT:      Head: Normocephalic and atraumatic.         Eyes: PERLA, EOMI, Conjunctivae are normal. Sclera is non-icteric.       Mouth/Throat: Mucous membranes are moist.       Neck:  Supple with no signs of meningismus. Cardiovascular: Regular rate and rhythm. No murmurs, gallops, or rubs. 2+ symmetrical distal pulses are present . No JVD. No LE edema Respiratory: Respiratory effort normal .Lungs sounds clear bilaterally. No wheezes, crackles, or rhonchi.  Gastrointestinal: Soft,, tender in left lower quadrant, non distended with positive bowel sounds.  Genitourinary: L CVA tenderness. Musculoskeletal: Nontender with normal range of motion in all extremities. No cyanosis, or erythema of extremities. Neurologic:  Face is symmetric. Moving all extremities. No gross focal neurologic deficits . Skin: Skin is warm, dry.  No rash or ulcers Psychiatric: Mood and affect are normal    Labs on Admission: I have personally reviewed following labs and imaging studies  CBC: Recent Labs  Lab 05/02/21 1617  WBC 11.4*  HGB 12.6  HCT 36.1  MCV 87.4  PLT 296   Basic Metabolic Panel: Recent Labs  Lab 05/02/21 1617  NA 136  K 3.5  CL 105  CO2 21*  GLUCOSE 117*  BUN 6  CREATININE 0.55  CALCIUM 9.3   GFR: Estimated Creatinine Clearance: 86.1 mL/min (by C-G formula based on SCr of 0.55 mg/dL). Liver Function Tests: No results for input(s): AST, ALT, ALKPHOS, BILITOT, PROT, ALBUMIN in the last 168 hours. No results for input(s): LIPASE, AMYLASE in the last 168 hours. No results for input(s): AMMONIA in the last 168 hours. Coagulation Profile: No results for input(s): INR, PROTIME in the last 168 hours. Cardiac Enzymes: No results for input(s): CKTOTAL, CKMB, CKMBINDEX, TROPONINI in the last 168 hours. BNP (last 3 results) No results for input(s): PROBNP in the last 8760 hours. HbA1C: Recent Labs    05/01/21 1040  HGBA1C 5.3   CBG: No results for input(s): GLUCAP in the last 168 hours. Lipid Profile: No results for input(s): CHOL, HDL, LDLCALC, TRIG, CHOLHDL, LDLDIRECT in the last 72 hours. Thyroid Function Tests: No results for input(s): TSH, T4TOTAL, FREET4,  T3FREE, THYROIDAB in the last 72 hours. Anemia Panel: No results for input(s): VITAMINB12, FOLATE, FERRITIN, TIBC, IRON, RETICCTPCT in the last 72 hours. Urine analysis:    Component Value Date/Time   COLORURINE YELLOW (A) 05/02/2021 1617   APPEARANCEUR CLOUDY (A) 05/02/2021 1617   APPEARANCEUR Clear 05/01/2021 1110   LABSPEC 1.012 05/02/2021 1617   PHURINE 6.0 05/02/2021 1617   GLUCOSEU NEGATIVE 05/02/2021 1617   HGBUR LARGE (A) 05/02/2021 1617   BILIRUBINUR NEGATIVE 05/02/2021 1617   BILIRUBINUR Negative 05/01/2021 1110   KETONESUR 5 (A) 05/02/2021 1617   PROTEINUR 30 (A) 05/02/2021 1617   NITRITE NEGATIVE 05/02/2021 1617   LEUKOCYTESUR MODERATE (A) 05/02/2021 1617    Radiological Exams on Admission: 07/02/2021 OB Comp Less 14 Wks  Result Date: 05/02/2021 CLINICAL DATA:  Pelvic pain EXAM: OBSTETRIC <14 WK  ULTRASOUND TECHNIQUE: Transabdominal ultrasound was performed for evaluation of the gestation as well as the maternal uterus and adnexal regions. COMPARISON:  None. FINDINGS: Intrauterine gestational sac: Visualized-single Yolk sac:  Not visualized Embryo:  Visualized Cardiac Activity: Visualized Heart Rate: 167 bpm CRL:   41 mm   11 w 0 d                  [place] EDC: November 21, 2021 Subchorionic hemorrhage:  None visualized. Maternal uterus/adnexae: Cervical os closed. Right ovary measures 3.3 x 1.6 x 1.8 cm. Left ovary measures 2.4 x 2.1 x 1.7 cm. No extrauterine pelvic mass or free fluid. IMPRESSION: Single live intrauterine gestation with estimated gestational age of [redacted] weeks. No evidence of chorionic hemorrhage. No extrauterine pelvic mass or fluid. Electronically Signed   By: Bretta Bang III M.D.   On: 05/02/2021 18:49   US Renal  Result Date: 05/02/2021 CLINICAL DATA:  Flank pain EXAM: RENAL / URINARY TRACT ULTRASOUND COMPLETE COMPARISON:  None. FINDINGS: Right Kidney: Renal measurements: 10.9 x 4.5 x 5.1 cm = volume: 132 mL. Echogenicity and renal cortical thickness are within  normal limits. No mass, perinephric fluid, or hydronephrosis visualized. No sonographically demonstrable calculus or ureterectasis Left Kidney: Renal measurements: 13.0 x 6.9 x 6.3 cm = volume: 293 mL. Echogenicity and renal cortical thickness are within normal limits. No mass or perinephric fluid visualized. Mild fullness of the left renal collecting system noted. No sonographically demonstrable calculus or ureterectasis. Left kidney appears subtly edematous. Bladder: Appears normal for degree of bladder distention. Other: None. IMPRESSION: Left kidney appears subtly edematous with fullness of the left renal collecting system. No ureterectasis. Question a degree of pyelonephritis. No perinephric fluid or renal abscess. Study otherwise unremarkable. Electronically Signed   By: Bretta Bang III M.D.   On: 05/02/2021 18:51     Assessment/Plan 30 year old female, G7P6006 at [redacted] weeks gestation with history of nephrolithiasis presenting with a several day history of left flank pain     Acute pyelonephritis   History of nephrolithiasis - Patient with left flank pain, abnormal UA and WBC 11,000.  Tachycardic without other sepsis criteria -Renal ultrasound: Left kidney appears slightly edematous with fullness of the left renal collecting system.  No ureterectasis.  Question a degree of pyelonephritis.  No perinephric fluid or renal abscess.  Study otherwise unremarkable - Continue IV Rocephin - Pain control, IV antiemetics - Follow urine cultures - Urology consult was done from the emergency room.  Dr. Apolinar Junes agrees to follow patient while in hospital, more emergently if patient becomes septic  History of elevated blood pressure in pregnancy - BP elevated in ER likely related to pain currently more controlled - Continue to monitor    First trimester pregnancy - Patient G7 P6 at 12 weeks.  Acute complication not suspected -OB ultrasound: Single live intrauterine gestation with estimated  gestational age of [redacted] weeks.  No evidence of chorionic hemorrhage no extrauterine pelvic mass or fluid -Consider OB/GYN consult in the a.m.    DVT prophylaxis: SCDs Code Status: full code  Family Communication:  none  Disposition Plan: Back to previous home environment Consults called: none  Status:At the time of admission, it appears that the appropriate admission status for this patient is INPATIENT. This is judged to be reasonable and necessary in order to provide the required intensity of service to ensure the patient's safety given the presenting symptoms, physical exam findings, and initial radiographic and laboratory data in the context of their  Comorbid conditions.   Patient requires inpatient status due to high intensity of service, high risk for further deterioration and high frequency of surveillance required.   I certify that at the point of admission it is my clinical judgment that the patient will require inpatient hospital care spanning beyond 2 midnights     Andris Baumann MD [facility]     05/02/2021, 7:35 PM

## 2021-05-02 NOTE — Telephone Encounter (Signed)
TC to patient to inform of [facility] U/S on 05/23/2021 at 10:00. Patient counseled to arrive at 9:45 am with a full bladder. This was first available appointment at [facility], per Darl Pikes in scheduling. Patient states understanding and denies questions. Interpreter, M. [place].Burt Knack, RN

## 2021-05-02 NOTE — ED Triage Notes (Signed)
Pt comes into the ED via POV c/o left flank pain.  Pt states she does have a h/o kidney stones and this feels similar.  PT states she also was told she is [redacted] weeks pregnant. Pt states she did also have some vaginal bleeding on Sunday.

## 2021-05-03 DIAGNOSIS — O2301 Infections of kidney in pregnancy, first trimester: Principal | ICD-10-CM

## 2021-05-03 LAB — IGP, APTIMA HPV
HPV Aptima: POSITIVE — AB
PAP Smear Comment: 0

## 2021-05-03 LAB — BASIC METABOLIC PANEL
Anion gap: 9 (ref 5–15)
BUN: 6 mg/dL (ref 6–20)
CO2: 22 mmol/L (ref 22–32)
Calcium: 9 mg/dL (ref 8.9–10.3)
Chloride: 105 mmol/L (ref 98–111)
Creatinine, Ser: 0.66 mg/dL (ref 0.44–1.00)
GFR, Estimated: >60 mL/min (ref >60)
Glucose, Bld: 98 mg/dL (ref 70–99)
Potassium: 3.8 mmol/L (ref 3.5–5.1)
Sodium: 136 mmol/L (ref 135–145)

## 2021-05-03 LAB — 789231 7+OXYCODONE-BUND
Amphetamines, Urine: NEGATIVE ng/mL
BENZODIAZ UR QL: NEGATIVE ng/mL
Barbiturate screen, urine: NEGATIVE ng/mL
Cannabinoid Quant, Ur: NEGATIVE ng/mL
Cocaine (Metab.): NEGATIVE ng/mL
OPIATE SCREEN URINE: NEGATIVE ng/mL
Oxycodone/Oxymorphone, Urine: NEGATIVE ng/mL
PCP Quant, Ur: NEGATIVE ng/mL

## 2021-05-03 LAB — CHLAMYDIA/GC NAA, CONFIRMATION
Chlamydia trachomatis, NAA: NEGATIVE
Neisseria gonorrhoeae, NAA: NEGATIVE

## 2021-05-03 LAB — CBC
HCT: 33.8 % — ABNORMAL LOW (ref 36.0–46.0)
Hemoglobin: 11.6 g/dL — ABNORMAL LOW (ref 12.0–15.0)
MCH: 30.2 pg (ref 26.0–34.0)
MCHC: 34.3 g/dL (ref 30.0–36.0)
MCV: 88 fL (ref 80.0–100.0)
Platelets: 244 10*3/uL (ref 150–400)
RBC: 3.84 MIL/uL — ABNORMAL LOW (ref 3.87–5.11)
RDW: 12.6 % (ref 11.5–15.5)
WBC: 12.6 10*3/uL — ABNORMAL HIGH (ref 4.0–10.5)
nRBC: 0 % (ref 0.0–0.2)

## 2021-05-03 LAB — HGB FRACTIONATION CASCADE
Hgb A2: 2.5 % (ref 1.8–3.2)
Hgb A: 97.5 % (ref 96.4–98.8)
Hgb F: 0 % (ref 0.0–2.0)
Hgb S: 0 %

## 2021-05-03 MED ORDER — HYDROMORPHONE HCL 1 MG/ML IJ SOLN
1.0000 mg | INTRAMUSCULAR | Status: DC | PRN
  Administered 2021-05-03 – 2021-05-05 (×8): 1 mg via INTRAVENOUS
  Filled 2021-05-03 (×8): qty 1

## 2021-05-03 MED ORDER — ACETAMINOPHEN 500 MG PO TABS
500.0000 mg | ORAL_TABLET | Freq: Once | ORAL | Status: AC
  Administered 2021-05-03: 500 mg via ORAL
  Filled 2021-05-03: qty 1

## 2021-05-03 MED ORDER — LACTATED RINGERS IV SOLN: INTRAVENOUS | Status: DC

## 2021-05-03 MED ORDER — MELATONIN 5 MG PO TABS
2.5000 mg | ORAL_TABLET | Freq: Every evening | ORAL | Status: DC | PRN
  Administered 2021-05-03: 2.5 mg via ORAL
  Filled 2021-05-03: qty 1

## 2021-05-03 MED ORDER — SODIUM CHLORIDE 0.9 % IV SOLN
2.0000 g | INTRAVENOUS | Status: DC
  Administered 2021-05-03 – 2021-05-05 (×3): 2 g via INTRAVENOUS
  Filled 2021-05-03 (×2): qty 2
  Filled 2021-05-03 (×2): qty 20

## 2021-05-03 NOTE — Progress Notes (Signed)
Received a call regarding the patient having left flank pain not controlled with IV morphine.  Called and discussed with OBGYN on call Dr. Dalbert Garnet regarding medications to use in pregnancy.  The patient is [redacted] weeks pregnant.  Ok to use Morphine or Dilaudid PRN for pain, also ok to use Rocephin as an antibiotic.  Can treat similarly as a non pregnant patient.  OBGYN will see in consult and assist with the management.  We highly appreciate our specialists consultative services.

## 2021-05-03 NOTE — Consult Note (Signed)
Urology Consult  I have been asked to see the patient by Dr. Larinda Buttery, for evaluation and management of pyelophritis.  Chief Complaint: Left flank pain, headache  History of Present Illness: Sherri Vasquez is a pregnant 30 y.o. year old female at [redacted]w[redacted]d admitted on 05/02/2021 with left pyelonephritis.  Admission labs notable for UA with >50 RBCs/hpf, >50 WBCs/hpf, many bacteria, 6-10 squamous epithelial cells/hpf, and calcium oxalate crystals; creatinine 0.55; and WBC count 11.4.  Today, creatinine is stable at 0.66 and WBC count is slightly up at 12.6.  Urine culture pending, on antibiotics as below.  She underwent renal ultrasound yesterday which revealed a slightly edematous left kidney with fullness of the renal collecting system consistent with pyelonephritis.  Patient reports she first noticed malodorous urine approximately 1 month ago but denies dysuria associated with this.  She had sudden onset of left flank pain 4 days ago. Today she reports intermittent left flank pain and headache.  She is afebrile, VSS  She has a history of nephrolithiasis in 2012 and 2019.  Her stone episode in 2019 was associated with urinary infection during pregnancy.  She states she passed the stone spontaneously at that time.  Anti-infectives (From admission, onward)   Start     Dose/Rate Route Frequency Ordered Stop   05/03/21 0445  cefTRIAXone (ROCEPHIN) 2 g in sodium chloride 0.9 % 100 mL IVPB        2 g 200 mL/hr over 30 Minutes Intravenous Every 24 hours 05/03/21 0349     05/02/21 1900  cefTRIAXone (ROCEPHIN) 1 g in sodium chloride 0.9 % 100 mL IVPB        1 g 200 mL/hr over 30 Minutes Intravenous  Once 05/02/21 1856 05/02/21 2104      Past Medical History:  Diagnosis Date  . Chronic kidney disease    2019 kidney stones during preg  . Hypertension    during labor and delivery with pregnancy 2019  . Patient denies medical problems     Past Surgical History:  Procedure Laterality Date   . NO PAST SURGERIES      Home Medications:  No outpatient medications have been marked as taking for the 05/02/21 encounter [facility] Encounter).    Allergies: No Known Allergies  Family History  Problem Relation Age of Onset  . Healthy Mother   . Healthy Father     Social History:  reports that she has never smoked. She has never used smokeless tobacco. She reports previous alcohol use. She reports that she does not use drugs.  ROS: A complete review of systems was performed.  All systems are negative except for pertinent findings as noted.  Physical Exam:  Vital signs in last 24 hours: Temp:  [98.1 F (36.7 C)-99.7 F (37.6 C)] 98.9 F (37.2 C) (05/12 0321) Pulse Rate:  [88-106] 92 (05/12 0321) Resp:  [16-20] 20 (05/12 0321) BP: (102-155)/(63-136) 107/68 (05/12 0321) SpO2:  [98 %-100 %] 100 % (05/12 0321) Weight:  [62.1 kg-62.5 kg] 62.5 kg (05/11 2032) Constitutional:  Alert and oriented, no acute distress HEENT: [place] AT, moist mucus membranes Cardiovascular: No clubbing, cyanosis, or edema Respiratory: Normal respiratory effort Skin: No rashes, bruises or suspicious lesions Neurologic: Grossly intact, no focal deficits, moving all 4 extremities Psychiatric: Normal mood and affect  Laboratory Data:  Recent Labs    05/02/21 1617 05/03/21 0250  WBC 11.4* 12.6*  HGB 12.6 11.6*  HCT 36.1 33.8*   Recent Labs    05/02/21 1617 05/03/21 0250  NA 136 136  K 3.5 3.8  CL 105 105  CO2 21* 22  GLUCOSE 117* 98  BUN 6 6  CREATININE 0.55 0.66  CALCIUM 9.3 9.0   Urinalysis    Component Value Date/Time   COLORURINE YELLOW (A) 05/02/2021 1617   APPEARANCEUR CLOUDY (A) 05/02/2021 1617   APPEARANCEUR Clear 05/01/2021 1110   LABSPEC 1.012 05/02/2021 1617   PHURINE 6.0 05/02/2021 1617   GLUCOSEU NEGATIVE 05/02/2021 1617   HGBUR LARGE (A) 05/02/2021 1617   BILIRUBINUR NEGATIVE 05/02/2021 1617   BILIRUBINUR Negative 05/01/2021 1110   KETONESUR 5 (A) 05/02/2021 1617    PROTEINUR 30 (A) 05/02/2021 1617   NITRITE NEGATIVE 05/02/2021 1617   LEUKOCYTESUR MODERATE (A) 05/02/2021 1617   Results for orders placed or performed during the hospital encounter of 05/02/21  Wet prep, genital     Status: Abnormal   Collection Time: 05/02/21  5:06 PM   Specimen: Vaginal  Result Value Ref Range Status   Yeast Wet Prep HPF POC NONE SEEN NONE SEEN Final   Trich, Wet Prep NONE SEEN NONE SEEN Final   Clue Cells Wet Prep HPF POC NONE SEEN NONE SEEN Final   WBC, Wet Prep HPF POC FEW (A) NONE SEEN Final   Sperm NONE SEEN  Final    Comment: Performed at [facility], [address] [place], [place] [address]  Chlamydia/NGC rt PCR [facility] only)     Status: None   Collection Time: 05/02/21  5:06 PM  Result Value Ref Range Status   Specimen source GC/Chlam ENDO  Final   Chlamydia Tr NOT DETECTED NOT DETECTED Final   N gonorrhoeae NOT DETECTED NOT DETECTED Final    Comment: (NOTE) This CT/NG assay has not been evaluated in patients with a history of  hysterectomy. Performed at [facility], [address] [place], [place] [address]   Resp Panel by RT-PCR (Flu A&B, Covid) Nasopharyngeal Swab     Status: None   Collection Time: 05/02/21  7:39 PM   Specimen: Nasopharyngeal Swab; Nasopharyngeal(NP) swabs in vial transport medium  Result Value Ref Range Status   SARS Coronavirus 2 by RT PCR NEGATIVE NEGATIVE Final    Comment: (NOTE) SARS-CoV-2 target nucleic acids are NOT DETECTED.  The SARS-CoV-2 RNA is generally detectable in upper respiratory specimens during the acute phase of infection. The lowest concentration of SARS-CoV-2 viral copies this assay can detect is 138 copies/mL. A negative result does not preclude SARS-Cov-2 infection and should not be used as the sole basis for treatment or other patient management decisions. A negative result may occur with  improper specimen collection/handling, submission of specimen other than nasopharyngeal  swab, presence of viral mutation(s) within the areas targeted by this assay, and inadequate number of viral copies(<138 copies/mL). A negative result must be combined with clinical observations, patient history, and epidemiological information. The expected result is Negative.  Fact Sheet for Patients:  BloggerCourse.comhttps://www.fda.gov/media/152166/download  Fact Sheet for Healthcare Providers:  SeriousBroker.ithttps://www.fda.gov/media/152162/download  This test is no t yet approved or cleared by the [place] FDA and  has been authorized for detection and/or diagnosis of SARS-CoV-2 by FDA under an Emergency Use Authorization (EUA). This EUA will remain  in effect (meaning this test can be used) for the duration of the COVID-19 declaration under Section 564(b)(1) of the Act, 21 U.S.C.section 360bbb-3(b)(1), unless the authorization is terminated  or revoked sooner.       Influenza A by PCR NEGATIVE NEGATIVE Final   Influenza B  by PCR NEGATIVE NEGATIVE Final    Comment: (NOTE) The Xpert Xpress SARS-CoV-2/FLU/RSV plus assay is intended as an aid in the diagnosis of influenza from Nasopharyngeal swab specimens and should not be used as a sole basis for treatment. Nasal washings and aspirates are unacceptable for Xpert Xpress SARS-CoV-2/FLU/RSV testing.  Fact Sheet for Patients: BloggerCourse.com  Fact Sheet for Healthcare Providers: SeriousBroker.it  This test is not yet approved or cleared by the [place] FDA and has been authorized for detection and/or diagnosis of SARS-CoV-2 by FDA under an Emergency Use Authorization (EUA). This EUA will remain in effect (meaning this test can be used) for the duration of the COVID-19 declaration under Section 564(b)(1) of the Act, 21 U.S.C. section 360bbb-3(b)(1), unless the authorization is terminated or revoked.  Performed at [facility], [address] [place], [place] [address]   CULTURE,  BLOOD (ROUTINE X 2) w Reflex to ID Panel     Status: None (Preliminary result)   Collection Time: 05/03/21  3:56 AM   Specimen: BLOOD  Result Value Ref Range Status   Specimen Description BLOOD LEFT ANTECUBITAL  Final   Special Requests   Final    BOTTLES DRAWN AEROBIC AND ANAEROBIC Blood Culture adequate volume   Culture   Final    NO GROWTH < 12 HOURS Performed at [facility], [address], [place], [place] [address]    Report Status PENDING  Incomplete  CULTURE, BLOOD (ROUTINE X 2) w Reflex to ID Panel     Status: None (Preliminary result)   Collection Time: 05/03/21  4:04 AM   Specimen: BLOOD  Result Value Ref Range Status   Specimen Description BLOOD BLOOD LEFT WRIST  Final   Special Requests   Final    BOTTLES DRAWN AEROBIC ONLY Blood Culture adequate volume   Culture   Final    NO GROWTH < 12 HOURS Performed at [facility], [address], [place], [place] [address]    Report Status PENDING  Incomplete    Radiologic Imaging: US OB Comp Less 14 Wks  Result Date: 05/02/2021 CLINICAL DATA:  Pelvic pain EXAM: OBSTETRIC <14 WK ULTRASOUND TECHNIQUE: Transabdominal ultrasound was performed for evaluation of the gestation as well as the maternal uterus and adnexal regions. COMPARISON:  None. FINDINGS: Intrauterine gestational sac: Visualized-single Yolk sac:  Not visualized Embryo:  Visualized Cardiac Activity: Visualized Heart Rate: 167 bpm CRL:   41 mm   11 w 0 d                  [place] EDC: November 21, 2021 Subchorionic hemorrhage:  None visualized. Maternal uterus/adnexae: Cervical os closed. Right ovary measures 3.3 x 1.6 x 1.8 cm. Left ovary measures 2.4 x 2.1 x 1.7 cm. No extrauterine pelvic mass or free fluid. IMPRESSION: Single live intrauterine gestation with estimated gestational age of [redacted] weeks. No evidence of chorionic hemorrhage. No extrauterine pelvic mass or fluid. Electronically Signed   By: Bretta Bang III M.D.   On: 05/02/2021 18:49   US  Renal  Result Date: 05/02/2021 CLINICAL DATA:  Flank pain EXAM: RENAL / URINARY TRACT ULTRASOUND COMPLETE COMPARISON:  None. FINDINGS: Right Kidney: Renal measurements: 10.9 x 4.5 x 5.1 cm = volume: 132 mL. Echogenicity and renal cortical thickness are within normal limits. No mass, perinephric fluid, or hydronephrosis visualized. No sonographically demonstrable calculus or ureterectasis Left Kidney: Renal measurements: 13.0 x 6.9 x 6.3 cm = volume: 293 mL. Echogenicity and renal cortical thickness are  within normal limits. No mass or perinephric fluid visualized. Mild fullness of the left renal collecting system noted. No sonographically demonstrable calculus or ureterectasis. Left kidney appears subtly edematous. Bladder: Appears normal for degree of bladder distention. Other: None. IMPRESSION: Left kidney appears subtly edematous with fullness of the left renal collecting system. No ureterectasis. Question a degree of pyelonephritis. No perinephric fluid or renal abscess. Study otherwise unremarkable. Electronically Signed   By: Bretta Bang III M.D.   On: 05/02/2021 18:51   Assessment & Plan:  30 year old pregnant female at [redacted]w[redacted]d with a history of nephrolithiasis including infected stone in 2019 admitted with left pyelonephritis.  Patient is stable on empiric antibiotics.  Low concern for obstructing stone given renal ultrasound findings consistent with pyelonephritis.  Recommend continued empiric antibiotics and follow urine cultures.  If patient decompensates or fails to improve, recommend further imaging to rule out underlying stone episode.  Thank you for involving me in this patient's care, I will continue to follow along.  Carman Ching, PA-C 05/03/2021 9:05 AM

## 2021-05-03 NOTE — Consult Note (Signed)
Consult History and Physical   SERVICE: Obstetrics  Patient Name: Sherri Vasquez Patient MRN:   [id number]  CC: Left flank pain  HPI: Sherri Vasquez is a 30 y.o. G7P6006 at 12+5wks by LMP of 02/03/21 with an EDD of 11/10/21 who presented to the ER with s/s of acute pyelonephritis. Urology has been consulted. She received Rocephin 2g q24wks and Dilaudid 1mg  q4hrs for pain control.  She is receiving pnc at [facility], first visit 05/01/21  Review of Systems: positives in bold GEN:   fevers, chills, weight changes, appetite changes, fatigue, night sweats HEENT:  HA, vision changes, hearing loss, congestion, rhinorrhea, sinus pressure, dysphagia CV:   CP, palpitations PULM:  SOB, cough GI:  abd pain, N/V/D/C GU:  dysuria, urgency, frequency MSK:  arthralgias, myalgias, back pain, swelling SKIN:  rashes, color changes, pallor NEURO:  numbness, weakness, tingling, seizures, dizziness, tremors PSYCH:  depression, anxiety, behavioral problems, confusion  HEME/LYMPH:  easy bruising or bleeding ENDO:  heat/cold intolerance  Past Obstetrical History: OB History    Gravida  7   Para  6   Term  6   Preterm  0   AB  0   Living  6     SAB  0   IAB  0   Ectopic  0   Multiple  0   Live Births  6           Past Gynecologic History: Patient's last menstrual period was 02/05/2021.   Past Medical History: Past Medical History:  Diagnosis Date  . Chronic kidney disease    2019 kidney stones during preg  . Hypertension    during labor and delivery with pregnancy 2019  . Patient denies medical problems     Past Surgical History:   Past Surgical History:  Procedure Laterality Date  . NO PAST SURGERIES      Family History:  family history includes Healthy in her father and mother.  Social History:  Social History   Socioeconomic History  . Marital status: Legally Separated    Spouse name: Not on file  . Number of children: 6  . Years of education: 8   . Highest education level: Not on file  Occupational History  . Not on file  Tobacco Use  . Smoking status: Never Smoker  . Smokeless tobacco: Never Used  . Tobacco comment: Denies secondhand smoke exposure  Vaping Use  . Vaping Use: Never used  Substance and Sexual Activity  . Alcohol use: Not Currently    Comment: last use- 2 mo ago  . Drug use: Never  . Sexual activity: Yes    Birth control/protection: None    Comment: Current Pregnant (2022)  Other Topics Concern  . Not on file  Social History Narrative  . Not on file   Social Determinants of Health   Financial Resource Strain: Not on file  Food Insecurity: No Food Insecurity  . Worried About 06-20-1994 in the Last Year: Never true  . Ran Out of Food in the Last Year: Never true  Transportation Needs: No Transportation Needs  . Lack of Transportation (Medical): No  . Lack of Transportation (Non-Medical): No  Physical Activity: Not on file  Stress: Not on file  Social Connections: Not on file  Intimate Partner Violence: Not At Risk  . Fear of Current or Ex-Partner: No  . Emotionally Abused: No  . Physically Abused: No  . Sexually Abused: No    Home Medications:  Medications reconciled in EPIC  No current facility-administered medications on file prior to encounter.   Current Outpatient Medications on File Prior to Encounter  Medication Sig Dispense Refill  . HYDROcodone-acetaminophen (NORCO) 5-325 MG tablet Take 1 tablet by mouth every 4 (four) hours as needed for moderate pain. (Patient not taking: Reported on 04/04/2021) 6 tablet 0  . ibuprofen (ADVIL) 800 MG tablet Take 1 tablet (800 mg total) by mouth every 8 (eight) hours as needed. (Patient not taking: Reported on 04/04/2021) 30 tablet 0  . metoCLOPramide (REGLAN) 5 MG tablet Take 1 tablet (5 mg total) by mouth every 8 (eight) hours as needed for nausea or vomiting. (Patient not taking: Reported on 04/04/2021) 15 tablet 0  . Prenatal Vit-Fe  Fumarate-FA (MULTIVITAMIN-PRENATAL) 27-0.8 MG TABS tablet Take 1 tablet by mouth daily at 12 noon. 100 tablet 0    Allergies:  No Known Allergies  Physical Exam:  Temp:  [97.3 F (36.3 C)-99.7 F (37.6 C)] 97.3 F (36.3 C) (05/12 1147) Pulse Rate:  [88-106] 92 (05/12 1147) Resp:  [16-27] 20 (05/12 1147) BP: (102-155)/(63-136) 109/73 (05/12 1147) SpO2:  [98 %-100 %] 99 % (05/12 1147) Weight:  [62.1 kg-62.5 kg] 62.5 kg (05/11 2032)   General Appearance:  Well developed, well nourished, no acute distress, alert and oriented x3 HExtremities:  Full range of motion, no pedal edema, 2+ distal pulses, no tenderness Skin:  normal coloration and turgor, no rashes, no suspicious skin lesions noted  Neurologic:  Cranial nerves 2-12 grossly intact, normal muscle tone, strength 5/5 all four extremities Psychiatric:  Normal mood and affect, appropriate, no AH/VH Pelvic:  Deferred   Labs/Studies:   CBC and Coags:  Lab Results  Component Value Date   WBC 12.6 (H) 05/03/2021   HGB 11.6 (L) 05/03/2021   HCT 33.8 (L) 05/03/2021   MCV 88.0 05/03/2021   PLT 244 05/03/2021   CMP:  Lab Results  Component Value Date   NA 136 05/03/2021   K 3.8 05/03/2021   CL 105 05/03/2021   CO2 22 05/03/2021   BUN 6 05/03/2021   CREATININE 0.66 05/03/2021   CREATININE 0.55 05/02/2021    Other Imaging: US OB Comp Less 14 Wks  Result Date: 05/02/2021 CLINICAL DATA:  Pelvic pain EXAM: OBSTETRIC <14 WK ULTRASOUND TECHNIQUE: Transabdominal ultrasound was performed for evaluation of the gestation as well as the maternal uterus and adnexal regions. COMPARISON:  None. FINDINGS: Intrauterine gestational sac: Visualized-single Yolk sac:  Not visualized Embryo:  Visualized Cardiac Activity: Visualized Heart Rate: 167 bpm CRL:   41 mm   11 w 0 d                  [place] EDC: November 21, 2021 Subchorionic hemorrhage:  None visualized. Maternal uterus/adnexae: Cervical os closed. Right ovary measures 3.3 x 1.6 x 1.8 cm.  Left ovary measures 2.4 x 2.1 x 1.7 cm. No extrauterine pelvic mass or free fluid. IMPRESSION: Single live intrauterine gestation with estimated gestational age of [redacted] weeks. No evidence of chorionic hemorrhage. No extrauterine pelvic mass or fluid. Electronically Signed   By: Bretta Bang III M.D.   On: 05/02/2021 18:49   US Renal  Result Date: 05/02/2021 CLINICAL DATA:  Flank pain EXAM: RENAL / URINARY TRACT ULTRASOUND COMPLETE COMPARISON:  None. FINDINGS: Right Kidney: Renal measurements: 10.9 x 4.5 x 5.1 cm = volume: 132 mL. Echogenicity and renal cortical thickness are within normal limits. No mass, perinephric fluid, or hydronephrosis visualized. No sonographically demonstrable calculus  or ureterectasis Left Kidney: Renal measurements: 13.0 x 6.9 x 6.3 cm = volume: 293 mL. Echogenicity and renal cortical thickness are within normal limits. No mass or perinephric fluid visualized. Mild fullness of the left renal collecting system noted. No sonographically demonstrable calculus or ureterectasis. Left kidney appears subtly edematous. Bladder: Appears normal for degree of bladder distention. Other: None. IMPRESSION: Left kidney appears subtly edematous with fullness of the left renal collecting system. No ureterectasis. Question a degree of pyelonephritis. No perinephric fluid or renal abscess. Study otherwise unremarkable. Electronically Signed   By: Bretta Bang III M.D.   On: 05/02/2021 18:51     Assessment / Plan:   Sherri Vasquez is a 30 y.o. [id number] at 12+3wks who presents with pyelo in pregnancy, now on rocephin  1. Pyleo: appreciate uro management. Agree with cephalosporins. Recommend against fluroquinolones and amnioglycosides in pregnancy. 2. Acetominophin is appropriate for fever and pain control, and we try to avoid NSAIDS in the first trimester. 3. Pain control with narcotics PRN is appropriate in pregnancy 4. Pyelonephritis increases the risk of ARDS and pulm edema in  pregnant women, usually on the side of the infection. Fluid management for pyelo appropriate. No particular precautions need to be taken with normal standard monitoring. 5. Recurrence rate of pyelonephritis during pregnancy is <10% but it is reasonable to continue ppx suppression for the rest of pregnancy. Macrobid 50-100mg  daily is reasonable. We would check a UA/Culture at 28wk visit if asx. If she is not on suppression, we would check a monthly UA/culture to screen for asx bacteruria.  Pregnancy care as usual. Confirmed IUP with fetal heart tones and normal placenta on u/s on admission. Next f/u ultrasound is scheduled for June 1 at 10:00 for normal pregnancy care.  Thank you for the opportunity to be involved with this pt's care.

## 2021-05-03 NOTE — Hospital Course (Addendum)
Pt does not have a payor sources and needs antibiotics upon an anticipated discharged this weekend. Pt pregnant and received care from [facility] Dept. Pt having finical hardship and will need medication assistance. Introduced medication management near the hospital for future sources of medication assistance however not available over the weekend. Will provider medication assistance through the [facility] dept for assistance with the recommended antibiotics upon her discharge. Explained the program for [facility] and delivered the prescription card to the pt at bedside. Explained the use within the 7 days noted on the card and pt has chosen [employer] in [place]. TOC RN has requested the provider call in needed prescriptions as discussed today on progression rounds in preparation for pt's discharge. No further needs at this time.

## 2021-05-03 NOTE — Progress Notes (Signed)
PROGRESS NOTE   Sherri Vasquez  FIE:[id number] DOB: [birth year] DOA: 05/02/2021 PCP: Patient, No Pcp Per (Inactive)  Brief Narrative:  30 year old G7 P6-0-0-6, LMP 02/03/2021 Not yet established with OB/GYN Prior left ankle fracture 03/14/2021 ?  Prior kidney stones High risk pregnancy with prior low birthweight infants at term --Non-smoker currently-occasional EtOH-seventh grade education-(see excellent note from 05/01/2021 Mrs. Arnetha Courser)  Developed lower into groin pain found to be afebrile but tachycardic to the 100 range WBC 11,000 UA moderate leukocytes many bacteria Renal ultrasound =?  Pyelonephritis Started IV Rocephin Dr. Apolinar Junes of urology consulted Dr. Dalbert Garnet OB/GYN curb sided with regards to medications to be able to use consulted also to assist with management  Blood cultures obtained 5/12 Urine culture obtained 5/11 Wet prep =  WBC  Hospital-Problem based course  Acute pyelonephritis Patient hospitalized for 5 days in [place] in 2019 Empiric Rocephin-copious IV fluid-pain control and nausea control Transition to oral antibiotics to complete 10 to 14 days total when able to take p.o. reliably Vascular input noted with appreciation Grand multiparity currently around [redacted] weeks gestation Appreciate OB input High risk pregnancy (small for dates babies X2, gestational hypertension last pregnancy) Outpatient follow-up with either [place] [facility] or Dr. Dalbert Garnet with regards to further prenatal care  DVT prophylaxis: Lovenox Code Status: Full Family Communication: No family present today Disposition:  Status is: Inpatient  Remains inpatient appropriate because:Persistent severe electrolyte disturbances, Unsafe d/c plan and IV treatments appropriate due to intensity of illness or inability to take PO   Dispo: The patient is from: Home              Anticipated d/c is to: Home              Patient currently is not medically stable to d/c.    Difficult to place patient No       Consultants:   Vascular surgery  OB/GYN  Procedures: Renal ultrasound  Antimicrobials: Ceftriaxone 2 g every 24   Subjective: Patient interviewed with interpreter She understands [place] however-she states she has nausea today has not really been hungry and has a headache She tells me she is not really had morning sickness in any of her other pregnancies She feels flank pain   Objective: Vitals:   05/02/21 2359 05/03/21 0321 05/03/21 0905 05/03/21 1147  BP: 102/63 107/68 116/75 109/73  Pulse: 88 92 (!) 106 92  Resp: 18 20 (!) 27 20  Temp: 98.6 F (37 C) 98.9 F (37.2 C) 98.4 F (36.9 C) (!) 97.3 F (36.3 C)  TempSrc: Oral Oral Oral Oral  SpO2: 98% 100% 99% 99%  Weight:      Height:        Intake/Output Summary (Last 24 hours) at 05/03/2021 1350 Last data filed at 05/03/2021 0449 Gross per 24 hour  Intake 1000 ml  Output 200 ml  Net 800 ml   Filed Weights   05/02/21 1614 05/02/21 2032  Weight: 62.1 kg 62.5 kg    Examination:  Looks about stated age EOMI NCAT no focal deficit no lymphadenopathy multiple tattoos on neck arm Chest clear no added sound Abdomen soft with right upper quadrant and right flank tenderness Genitourinary deferred No lower extremity swelling Psych euthymic  Data Reviewed: personally reviewed   CBC    Component Value Date/Time   WBC 12.6 (H) 05/03/2021 0250   RBC 3.84 (L) 05/03/2021 0250   HGB 11.6 (L) 05/03/2021 0250   HCT 33.8 (L) 05/03/2021 0250  PLT 244 05/03/2021 0250   MCV 88.0 05/03/2021 0250   MCH 30.2 05/03/2021 0250   MCHC 34.3 05/03/2021 0250   RDW 12.6 05/03/2021 0250   CMP Latest Ref Rng & Units 05/03/2021 05/02/2021  Glucose 70 - 99 mg/dL 98 009(F)  BUN 6 - 20 mg/dL 6 6  Creatinine 8.18 - 1.00 mg/dL 2.99 3.71  Sodium 696 - 145 mmol/L 136 136  Potassium 3.5 - 5.1 mmol/L 3.8 3.5  Chloride 98 - 111 mmol/L 105 105  CO2 22 - 32 mmol/L 22 21(L)  Calcium 8.9 - 10.3 mg/dL  9.0 9.3     Radiology Studies: US OB Comp Less 14 Wks  Result Date: 05/02/2021 CLINICAL DATA:  Pelvic pain EXAM: OBSTETRIC <14 WK ULTRASOUND TECHNIQUE: Transabdominal ultrasound was performed for evaluation of the gestation as well as the maternal uterus and adnexal regions. COMPARISON:  None. FINDINGS: Intrauterine gestational sac: Visualized-single Yolk sac:  Not visualized Embryo:  Visualized Cardiac Activity: Visualized Heart Rate: 167 bpm CRL:   41 mm   11 w 0 d                  [place] EDC: November 21, 2021 Subchorionic hemorrhage:  None visualized. Maternal uterus/adnexae: Cervical os closed. Right ovary measures 3.3 x 1.6 x 1.8 cm. Left ovary measures 2.4 x 2.1 x 1.7 cm. No extrauterine pelvic mass or free fluid. IMPRESSION: Single live intrauterine gestation with estimated gestational age of [redacted] weeks. No evidence of chorionic hemorrhage. No extrauterine pelvic mass or fluid. Electronically Signed   By: Bretta Bang III M.D.   On: 05/02/2021 18:49   US Renal  Result Date: 05/02/2021 CLINICAL DATA:  Flank pain EXAM: RENAL / URINARY TRACT ULTRASOUND COMPLETE COMPARISON:  None. FINDINGS: Right Kidney: Renal measurements: 10.9 x 4.5 x 5.1 cm = volume: 132 mL. Echogenicity and renal cortical thickness are within normal limits. No mass, perinephric fluid, or hydronephrosis visualized. No sonographically demonstrable calculus or ureterectasis Left Kidney: Renal measurements: 13.0 x 6.9 x 6.3 cm = volume: 293 mL. Echogenicity and renal cortical thickness are within normal limits. No mass or perinephric fluid visualized. Mild fullness of the left renal collecting system noted. No sonographically demonstrable calculus or ureterectasis. Left kidney appears subtly edematous. Bladder: Appears normal for degree of bladder distention. Other: None. IMPRESSION: Left kidney appears subtly edematous with fullness of the left renal collecting system. No ureterectasis. Question a degree of pyelonephritis. No  perinephric fluid or renal abscess. Study otherwise unremarkable. Electronically Signed   By: Bretta Bang III M.D.   On: 05/02/2021 18:51     Scheduled Meds: Continuous Infusions: . cefTRIAXone (ROCEPHIN)  IV 2 g (05/03/21 0533)  . lactated ringers 50 mL/hr at 05/03/21 0631     LOS: 1 day   Time spent: 47  Rhetta Mura, MD [facility] To contact the attending provider between 7A-7P or the covering provider during after hours 7P-7A, please log into the web site [email or web address] and access using universal [place] password for that web site. If you do not have the password, please call the hospital operator.  05/03/2021, 1:50 PM

## 2021-05-04 ENCOUNTER — Encounter: Payer: Self-pay | Admitting: Advanced Practice Midwife

## 2021-05-04 DIAGNOSIS — R87619 Unspecified abnormal cytological findings in specimens from cervix uteri: Secondary | ICD-10-CM

## 2021-05-04 HISTORY — DX: Unspecified abnormal cytological findings in specimens from cervix uteri: R87.619

## 2021-05-04 LAB — CBC WITH DIFFERENTIAL/PLATELET
Abs Immature Granulocytes: 0.05 10*3/uL (ref 0.00–0.07)
Basophils Absolute: 0 10*3/uL (ref 0.0–0.1)
Basophils Relative: 0 %
Eosinophils Absolute: 0 10*3/uL (ref 0.0–0.5)
Eosinophils Relative: 0 %
HCT: 31.4 % — ABNORMAL LOW (ref 36.0–46.0)
Hemoglobin: 11.1 g/dL — ABNORMAL LOW (ref 12.0–15.0)
Immature Granulocytes: 1 %
Lymphocytes Relative: 13 %
Lymphs Abs: 1.1 10*3/uL (ref 0.7–4.0)
MCH: 31.4 pg (ref 26.0–34.0)
MCHC: 35.4 g/dL (ref 30.0–36.0)
MCV: 89 fL (ref 80.0–100.0)
Monocytes Absolute: 0.8 10*3/uL (ref 0.1–1.0)
Monocytes Relative: 10 %
Neutro Abs: 6.2 10*3/uL (ref 1.7–7.7)
Neutrophils Relative %: 76 %
Platelets: 209 10*3/uL (ref 150–400)
RBC: 3.53 MIL/uL — ABNORMAL LOW (ref 3.87–5.11)
RDW: 12.8 % (ref 11.5–15.5)
WBC: 8.2 10*3/uL (ref 4.0–10.5)
nRBC: 0 % (ref 0.0–0.2)

## 2021-05-04 LAB — COMPREHENSIVE METABOLIC PANEL
ALT: 14 U/L (ref 0–44)
AST: 19 U/L (ref 15–41)
Albumin: 2.8 g/dL — ABNORMAL LOW (ref 3.5–5.0)
Alkaline Phosphatase: 52 U/L (ref 38–126)
Anion gap: 7 (ref 5–15)
BUN: 6 mg/dL (ref 6–20)
CO2: 22 mmol/L (ref 22–32)
Calcium: 8.4 mg/dL — ABNORMAL LOW (ref 8.9–10.3)
Chloride: 109 mmol/L (ref 98–111)
Creatinine, Ser: 0.94 mg/dL (ref 0.44–1.00)
GFR, Estimated: >60 mL/min (ref >60)
Glucose, Bld: 86 mg/dL (ref 70–99)
Potassium: 3.4 mmol/L — ABNORMAL LOW (ref 3.5–5.1)
Sodium: 138 mmol/L (ref 135–145)
Total Bilirubin: 0.4 mg/dL (ref 0.3–1.2)
Total Protein: 5.9 g/dL — ABNORMAL LOW (ref 6.5–8.1)

## 2021-05-04 LAB — SODIUM, URINE, RANDOM: Sodium, Ur: 94 mmol/L

## 2021-05-04 MED ORDER — IBUPROFEN 400 MG PO TABS
600.0000 mg | ORAL_TABLET | Freq: Three times a day (TID) | ORAL | Status: DC
  Administered 2021-05-04 – 2021-05-06 (×7): 600 mg via ORAL
  Filled 2021-05-04 (×3): qty 2
  Filled 2021-05-04 (×2): qty 1
  Filled 2021-05-04: qty 2
  Filled 2021-05-04: qty 1

## 2021-05-04 MED ORDER — DOXYLAMINE SUCCINATE (SLEEP) 25 MG PO TABS
25.0000 mg | ORAL_TABLET | Freq: Every evening | ORAL | Status: DC | PRN
  Administered 2021-05-04 – 2021-05-05 (×2): 25 mg via ORAL
  Filled 2021-05-04 (×3): qty 1

## 2021-05-04 NOTE — Progress Notes (Signed)
PROGRESS NOTE   Sherri Vasquez  ZDG:[id number] DOB: 1991-10-11 DOA: 05/02/2021 PCP: Patient, No Pcp Per (Inactive)  Brief Narrative:  30 year old G7 P6-0-0-6, LMP 02/03/2021 Not yet established with OB/GYN Prior left ankle fracture 03/14/2021 ?  Prior kidney stones High risk pregnancy with prior low birthweight infants at term --Non-smoker currently-occasional EtOH-seventh grade education-(see excellent note from 05/01/2021 Mrs. Arnetha Courser)  Loin to groin pain on admission, HR 100 range--found to have meet sepsis criteria Renal ultrasound =?  Pyelonephritis Started IV Rocephin Dr. Apolinar Junes of urology consulted Dr. Dalbert Garnet OB/GYN consulted in edition  Blood cultures obtained 5/12 Urine culture obtained 5/11 Wet prep =  WBC  Hospital-Problem based course  Acute pyelonephritis likely secondary to gram-negative rods in urine Patient hospitalized for 5 days in [place] in 2019 Empiric Rocephin-copious IV fluid-pain control and nausea control Still nauseous and not keeping down a whole lot in addition significant headache Continue Rocephin until blood culture speciate Headache Not in time range of viability-unlikely PIH Will give ibuprofen as tramadol is unknown category in pregnancy Grand multiparity currently around [redacted] weeks gestation Appreciate OB input High risk pregnancy (small for dates babies X2, gestational hypertension last pregnancy) Outpatient follow-up per Dr. Dalbert Garnet  DVT prophylaxis: Lovenox Code Status: Full Family Communication: No family present today Disposition:  Status is: Inpatient  Remains inpatient appropriate because:Persistent severe electrolyte disturbances, Unsafe d/c plan and IV treatments appropriate due to intensity of illness or inability to take PO   Dispo: The patient is from: Home              Anticipated d/c is to: Home              Patient currently is not medically stable to d/c.   Difficult to place patient No        Consultants:   Vascular surgery  OB/GYN  Procedures: Renal ultrasound  Antimicrobials: Ceftriaxone 2 g every 24   Subjective:  Some headache Some nausea Tolerating some diet however feels a little better but main concern is photosensitivity   Objective: Vitals:   05/03/21 2249 05/04/21 0028 05/04/21 0301 05/04/21 1123  BP: 116/72 129/70 102/62 107/66  Pulse: (!) 104 (!) 104 89 82  Resp: 20 (!) 22 18 16   Temp: 100.2 F (37.9 C) 100.2 F (37.9 C) 98.7 F (37.1 C) 97.7 F (36.5 C)  TempSrc:  Oral  Oral  SpO2: 99% 99% 99% 96%  Weight:      Height:        Intake/Output Summary (Last 24 hours) at 05/04/2021 1538 Last data filed at 05/04/2021 0910 Gross per 24 hour  Intake 2193.82 ml  Output 600 ml  Net 1593.82 ml   Filed Weights   05/02/21 1614 05/02/21 2032  Weight: 62.1 kg 62.5 kg    Examination:  Young Hispanic female no distress slightly tired appearing CTA B no added sound Flank tenderness left side on exam Abdominal tenderness is mild No lower extremity edema   Data Reviewed: personally reviewed   CBC    Component Value Date/Time   WBC 8.2 05/04/2021 0534   RBC 3.53 (L) 05/04/2021 0534   HGB 11.1 (L) 05/04/2021 0534   HCT 31.4 (L) 05/04/2021 0534   PLT 209 05/04/2021 0534   MCV 89.0 05/04/2021 0534   MCH 31.4 05/04/2021 0534   MCHC 35.4 05/04/2021 0534   RDW 12.8 05/04/2021 0534   LYMPHSABS 1.1 05/04/2021 0534   MONOABS 0.8 05/04/2021 0534   EOSABS 0.0 05/04/2021 0534  BASOSABS 0.0 05/04/2021 0534   CMP Latest Ref Rng & Units 05/04/2021 05/03/2021 05/02/2021  Glucose 70 - 99 mg/dL 86 98 824(M)  BUN 6 - 20 mg/dL 6 6 6   Creatinine 0.44 - 1.00 mg/dL 3.53 6.14  Sodium 135 - 145 mmol/L 138 136 136  Potassium 3.5 - 5.1 mmol/L 3.4(L) 3.8 3.5  Chloride 98 - 111 mmol/L 109 105 105  CO2 22 - 32 mmol/L 22 22 21(L)  Calcium 8.9 - 10.3 mg/dL 4.31) 9.0 9.3  Total Protein 6.5 - 8.1 g/dL 5.9(L) - -  Total Bilirubin 0.3 - 1.2 mg/dL 0.4 - -   Alkaline Phos 38 - 126 U/L 52 - -  AST 15 - 41 U/L 19 - -  ALT 0 - 44 U/L 14 - -     Radiology Studies: 5.4(M OB Comp Less 14 Wks  Result Date: 05/02/2021 CLINICAL DATA:  Pelvic pain EXAM: OBSTETRIC <14 WK ULTRASOUND TECHNIQUE: Transabdominal ultrasound was performed for evaluation of the gestation as well as the maternal uterus and adnexal regions. COMPARISON:  None. FINDINGS: Intrauterine gestational sac: Visualized-single Yolk sac:  Not visualized Embryo:  Visualized Cardiac Activity: Visualized Heart Rate: 167 bpm CRL:   41 mm   11 w 0 d                  07/02/2021 EDC: November 21, 2021 Subchorionic hemorrhage:  None visualized. Maternal uterus/adnexae: Cervical os closed. Right ovary measures 3.3 x 1.6 x 1.8 cm. Left ovary measures 2.4 x 2.1 x 1.7 cm. No extrauterine pelvic mass or free fluid. IMPRESSION: Single live intrauterine gestation with estimated gestational age of [redacted] weeks. No evidence of chorionic hemorrhage. No extrauterine pelvic mass or fluid. Electronically Signed   By: 4 weeks III M.D.   On: 05/02/2021 18:49   07/02/2021 Renal  Result Date: 05/02/2021 CLINICAL DATA:  Flank pain EXAM: RENAL / URINARY TRACT ULTRASOUND COMPLETE COMPARISON:  None. FINDINGS: Right Kidney: Renal measurements: 10.9 x 4.5 x 5.1 cm = volume: 132 mL. Echogenicity and renal cortical thickness are within normal limits. No mass, perinephric fluid, or hydronephrosis visualized. No sonographically demonstrable calculus or ureterectasis Left Kidney: Renal measurements: 13.0 x 6.9 x 6.3 cm = volume: 293 mL. Echogenicity and renal cortical thickness are within normal limits. No mass or perinephric fluid visualized. Mild fullness of the left renal collecting system noted. No sonographically demonstrable calculus or ureterectasis. Left kidney appears subtly edematous. Bladder: Appears normal for degree of bladder distention. Other: None. IMPRESSION: Left kidney appears subtly edematous with fullness of the left renal  collecting system. No ureterectasis. Question a degree of pyelonephritis. No perinephric fluid or renal abscess. Study otherwise unremarkable. Electronically Signed   By: 07/02/2021 III M.D.   On: 05/02/2021 18:51     Scheduled Meds: . ibuprofen  600 mg Oral TID   Continuous Infusions: . cefTRIAXone (ROCEPHIN)  IV Stopped (05/04/21 0531)  . lactated ringers 125 mL/hr at 05/04/21 0910     LOS: 2 days   Time spent: 2  12, MD [facility] To contact the attending provider between 7A-7P or the covering provider during after hours 7P-7A, please log into the web site [email or web address] and access using universal [place] password for that web site. If you do not have the password, please call the hospital operator.  05/04/2021, 3:38 PM

## 2021-05-04 NOTE — Plan of Care (Signed)
Continuing with plan of care. 

## 2021-05-04 NOTE — Progress Notes (Signed)
   05/03/21 2026  Assess: MEWS Score  Temp 99.3 F (37.4 C)  BP (!) 126/95  Pulse Rate (!) 106  Resp (!) 23  SpO2 100 %  O2 Device Room Air  Assess: MEWS Score  MEWS Temp 0  MEWS Systolic 0  MEWS Pulse 1  MEWS RR 1  MEWS LOC 0  MEWS Score 2  MEWS Score Color Yellow  Assess: if the MEWS score is Yellow or Red  Were vital signs taken at a resting state? No  Focused Assessment No change from prior assessment  Early Detection of Sepsis Score *See Row Information* Low  MEWS guidelines implemented *See Row Information* Yes  Take Vital Signs  Increase Vital Sign Frequency  Yellow: Q 2hr X 2 then Q 4hr X 2, if remains yellow, continue Q 4hrs  Escalate  MEWS: Escalate Yellow: discuss with charge nurse/RN and consider discussing with provider and RRT  Notify: Charge Nurse/RN  Name of Charge Nurse/RN Notified Celine RN  Date Charge Nurse/RN Notified 05/03/21  Time Charge Nurse/RN Notified 2100  Notify: Provider  Provider Name/Title Dr Raphael Gibney  Date Provider Notified 05/03/21  Time Provider Notified 2030  Notification Type Page  Notification Reason Other (Comment) (temp chills)  Provider response No new orders  Date of Provider Response 05/03/21  Time of Provider Response 2040  Document  Patient Outcome Other (Comment) (continued to monitor)  Progress note created (see row info) Yes

## 2021-05-04 NOTE — Progress Notes (Signed)
Urology Inpatient Progress Note  Subjective: No acute events overnight.  She was borderline tachycardic overnight, now resolved.  Last fever around noon yesterday. Creatinine up today, 0.94.  WBC count down today, 8.2.  Urine culture preliminary positive for gram-negative rods.  Blood cultures pending with no growth at 1 day.  On antibiotics as below. She reports stable to slightly improved left flank pain with worsening headache.  Anti-infectives: Anti-infectives (From admission, onward)   Start     Dose/Rate Route Frequency Ordered Stop   05/03/21 0445  cefTRIAXone (ROCEPHIN) 2 g in sodium chloride 0.9 % 100 mL IVPB        2 g 200 mL/hr over 30 Minutes Intravenous Every 24 hours 05/03/21 0349     05/02/21 1900  cefTRIAXone (ROCEPHIN) 1 g in sodium chloride 0.9 % 100 mL IVPB        1 g 200 mL/hr over 30 Minutes Intravenous  Once 05/02/21 1856 05/02/21 2104      Current Facility-Administered Medications  Medication Dose Route Frequency Provider Last Rate Last Admin  . acetaminophen (TYLENOL) tablet 650 mg  650 mg Oral Q6H PRN Andris Baumann, MD   650 mg at 05/03/21 2027   Or  . acetaminophen (TYLENOL) suppository 650 mg  650 mg Rectal Q6H PRN Andris Baumann, MD      . cefTRIAXone (ROCEPHIN) 2 g in sodium chloride 0.9 % 100 mL IVPB  2 g Intravenous Q24H Dow Adolph N, DO 200 mL/hr at 05/04/21 0501 2 g at 05/04/21 0501  . HYDROmorphone (DILAUDID) injection 1 mg  1 mg Intravenous Q4H PRN Dow Adolph N, DO   1 mg at 05/04/21 0865  . lactated ringers infusion   Intravenous Continuous Rhetta Mura, MD 125 mL/hr at 05/04/21 0325 Infusion Verify at 05/04/21 0325  . melatonin tablet 2.5 mg  2.5 mg Oral QHS PRN Dow Adolph N, DO   2.5 mg at 05/03/21 2102  . ondansetron (ZOFRAN) tablet 4 mg  4 mg Oral Q6H PRN Andris Baumann, MD   4 mg at 05/03/21 1110   Or  . ondansetron (ZOFRAN) injection 4 mg  4 mg Intravenous Q6H PRN Andris Baumann, MD   4 mg at 05/04/21 0746    Objective: Vital signs in last 24 hours: Temp:  [97.3 F (36.3 C)-100.2 F (37.9 C)] 98.7 F (37.1 C) (05/13 0301) Pulse Rate:  [88-106] 89 (05/13 0301) Resp:  [16-27] 18 (05/13 0301) BP: (102-129)/(62-95) 102/62 (05/13 0301) SpO2:  [99 %-100 %] 99 % (05/13 0301)  Intake/Output from previous day: 05/12 0701 - 05/13 0700 In: 1563.6 [I.V.:1463.6; IV Piggyback:100] Out: 1100 [Urine:1100] Intake/Output this shift: No intake/output data recorded.  Physical Exam Vitals and nursing note reviewed.  Constitutional:      Appearance: She is not ill-appearing, toxic-appearing or diaphoretic.  HENT:     Head: Normocephalic and atraumatic.  Pulmonary:     Effort: Pulmonary effort is normal. No respiratory distress.  Skin:    General: Skin is warm and dry.  Neurological:     Mental Status: She is alert and oriented to person, place, and time.  Psychiatric:        Mood and Affect: Mood normal.        Behavior: Behavior normal.    Lab Results:  Recent Labs    05/03/21 0250 05/04/21 0534  WBC 12.6* 8.2  HGB 11.6* 11.1*  HCT 33.8* 31.4*  PLT 244 209   BMET Recent Labs    05/03/21  0250 05/04/21 0534  NA 136 138  K 3.8 3.4*  CL 105 109  CO2 22 22  GLUCOSE 98 86  BUN 6 6  CREATININE 0.66 0.94  CALCIUM 9.0 8.4*   Assessment & Plan: 30 year old pregnant female at [redacted]w[redacted]d with a history of nephrolithiasis including infected stone in 2019 admitted with left pyelonephritis.  Patient is stable on empiric antibiotics.  Recommendations: -Continue empiric antibiotics and follow cultures.  She will require a total of 14 days of culture appropriate therapy  Urology will sign off at this time, please reach out to [place] again if she acutely decompensates.  Carman Ching, PA-C 05/04/2021

## 2021-05-05 LAB — CBC/D/PLT+RPR+RH+ABO+RUB AB...
Antibody Screen: NEGATIVE
Basophils Absolute: 0.1 10*3/uL (ref 0.0–0.2)
Basos: 1 %
EOS (ABSOLUTE): 0.1 10*3/uL (ref 0.0–0.4)
Eos: 1 %
Hematocrit: 38.2 % (ref 34.0–46.6)
Hemoglobin: 12.8 g/dL (ref 11.1–15.9)
Hepatitis B Surface Ag: NEGATIVE
Immature Grans (Abs): 0 10*3/uL (ref 0.0–0.1)
Immature Granulocytes: 0 %
Lymphocytes Absolute: 2.3 10*3/uL (ref 0.7–3.1)
Lymphs: 25 %
MCH: 30.1 pg (ref 26.6–33.0)
MCHC: 33.5 g/dL (ref 31.5–35.7)
MCV: 90 fL (ref 79–97)
Monocytes Absolute: 0.5 10*3/uL (ref 0.1–0.9)
Monocytes: 5 %
Neutrophils Absolute: 6.1 10*3/uL (ref 1.4–7.0)
Neutrophils: 68 %
Platelets: 333 10*3/uL (ref 150–450)
RBC: 4.25 x10E6/uL (ref 3.77–5.28)
RDW: 12.9 % (ref 11.7–15.4)
RPR Ser Ql: NONREACTIVE
Rh Factor: POSITIVE
Rubella Antibodies, IGG: 2.57 index (ref >0.99)
Varicella zoster IgG: <135 index — ABNORMAL LOW (ref >165)
WBC: 9 10*3/uL (ref 3.4–10.8)

## 2021-05-05 LAB — CBC WITH DIFFERENTIAL/PLATELET
Abs Immature Granulocytes: 0.03 10*3/uL (ref 0.00–0.07)
Basophils Absolute: 0 10*3/uL (ref 0.0–0.1)
Basophils Relative: 0 %
Eosinophils Absolute: 0.2 10*3/uL (ref 0.0–0.5)
Eosinophils Relative: 3 %
HCT: 29.3 % — ABNORMAL LOW (ref 36.0–46.0)
Hemoglobin: 10.2 g/dL — ABNORMAL LOW (ref 12.0–15.0)
Immature Granulocytes: 1 %
Lymphocytes Relative: 25 %
Lymphs Abs: 1.5 10*3/uL (ref 0.7–4.0)
MCH: 30.9 pg (ref 26.0–34.0)
MCHC: 34.8 g/dL (ref 30.0–36.0)
MCV: 88.8 fL (ref 80.0–100.0)
Monocytes Absolute: 0.7 10*3/uL (ref 0.1–1.0)
Monocytes Relative: 11 %
Neutro Abs: 3.7 10*3/uL (ref 1.7–7.7)
Neutrophils Relative %: 60 %
Platelets: 205 10*3/uL (ref 150–400)
RBC: 3.3 MIL/uL — ABNORMAL LOW (ref 3.87–5.11)
RDW: 12.6 % (ref 11.5–15.5)
WBC: 6.1 10*3/uL (ref 4.0–10.5)
nRBC: 0 % (ref 0.0–0.2)

## 2021-05-05 LAB — HCV AB W REFLEX TO QUANT PCR: HCV Ab: <0.1 s/co ratio (ref 0.0–0.9)

## 2021-05-05 LAB — COMPREHENSIVE METABOLIC PANEL
ALT: 12 U/L (ref 0–44)
AST: 19 U/L (ref 15–41)
Albumin: 2.8 g/dL — ABNORMAL LOW (ref 3.5–5.0)
Alkaline Phosphatase: 54 U/L (ref 38–126)
Anion gap: 8 (ref 5–15)
BUN: 7 mg/dL (ref 6–20)
CO2: 22 mmol/L (ref 22–32)
Calcium: 8.4 mg/dL — ABNORMAL LOW (ref 8.9–10.3)
Chloride: 106 mmol/L (ref 98–111)
Creatinine, Ser: 0.85 mg/dL (ref 0.44–1.00)
GFR, Estimated: >60 mL/min (ref >60)
Glucose, Bld: 83 mg/dL (ref 70–99)
Potassium: 3.6 mmol/L (ref 3.5–5.1)
Sodium: 136 mmol/L (ref 135–145)
Total Bilirubin: 0.3 mg/dL (ref 0.3–1.2)
Total Protein: 5.8 g/dL — ABNORMAL LOW (ref 6.5–8.1)

## 2021-05-05 LAB — URINE CULTURE: Culture: >=100000 — AB

## 2021-05-05 LAB — HIV-1/HIV-2 QUALITATIVE RNA
HIV-1 RNA, Qualitative: NONREACTIVE
HIV-2 RNA, Qualitative: NONREACTIVE

## 2021-05-05 LAB — QUANTIFERON-TB GOLD PLUS
QuantiFERON Mitogen Value: >10 IU/mL
QuantiFERON Nil Value: 0.05 IU/mL
QuantiFERON TB1 Ag Value: 0.04 IU/mL
QuantiFERON TB2 Ag Value: 0.04 IU/mL
QuantiFERON-TB Gold Plus: NEGATIVE

## 2021-05-05 LAB — HCV INTERPRETATION

## 2021-05-05 MED ORDER — OXYCODONE-ACETAMINOPHEN 7.5-325 MG PO TABS
1.0000 | ORAL_TABLET | Freq: Four times a day (QID) | ORAL | Status: DC | PRN
  Administered 2021-05-05 (×2): 1 via ORAL
  Filled 2021-05-05 (×2): qty 1

## 2021-05-05 MED ORDER — CEFDINIR 300 MG PO CAPS
300.0000 mg | ORAL_CAPSULE | Freq: Two times a day (BID) | ORAL | Status: DC
  Administered 2021-05-05 – 2021-05-06 (×3): 300 mg via ORAL
  Filled 2021-05-05 (×4): qty 1

## 2021-05-05 NOTE — Progress Notes (Signed)
PROGRESS NOTE   Sherri Vasquez  KPT:[id number] DOB: 1991-09-01 DOA: 05/02/2021 PCP: Patient, No Pcp Per (Inactive)  Brief Narrative:  30 year old G7 P6-0-0-6, LMP 02/03/2021 Not yet established with OB/GYN Prior left ankle fracture 03/14/2021 ?  Prior kidney stones High risk pregnancy with prior low birthweight infants at term --Non-smoker currently-occasional EtOH-seventh grade education-(see excellent note from 05/01/2021 Mrs. Arnetha Courser)  Loin to groin pain on admission, HR 100 range--found to have meet sepsis criteria Renal ultrasound =?  Pyelonephritis Started IV Rocephin  urologist Dr. Apolinar Junes OB/GYN Dr. Dalbert Garnet consulted  Blood cultures obtained 5/12 Urine culture obtained 5/11 growing E. coli Wet prep =  WBC  Hospital-Problem based course  Acute pyelonephritis likely to E. coli Patient hospitalized for 5 days in [place] in 2019 with similar current Empiric Rocephin transition to [facility] twice daily for 14 days--DC IV fluid Less nausea-improving slowly Still requiring IV pain meds Headache unlikely PIH Continue ibuprofen for headaches Grand multiparity currently around [redacted] weeks gestation Appreciate OB input High risk pregnancy (small for dates babies X2, gestational hypertension last pregnancy) Outpatient follow-up per Dr. Dalbert Garnet and or [facility] health department  DVT prophylaxis: SCDs Code Status: Full Family Communication:  discussed with boyfriend at the bedside Disposition:  Status is: Inpatient  Remains inpatient appropriate because:Persistent severe electrolyte disturbances, Unsafe d/c plan and IV treatments appropriate due to intensity of illness or inability to take PO   Dispo: The patient is from: Home              Anticipated d/c is to: Home              Patient currently is not medically stable to d/c.   Difficult to place patient No       Consultants:   Vascular surgery  OB/GYN  Procedures: Renal ultrasound  Antimicrobials:  Ceftriaxone 2 g every 24   Subjective:  Awake coherent no distress Still requiring IV morphine Eating small amounts Mild headache No bleeding Still has flank pain Tolerating Percocet   Objective: Vitals:   05/04/21 2307 05/05/21 0353 05/05/21 1222 05/05/21 1630  BP: (!) 91/55 99/67 113/66 117/82  Pulse: 66 72 63 70  Resp: 18 18 18 18   Temp: 98.7 F (37.1 C) 98.5 F (36.9 C) 98.3 F (36.8 C) 97.9 F (36.6 C)  TempSrc: Oral Oral    SpO2: 100% 100% 98% 99%  Weight:      Height:        Intake/Output Summary (Last 24 hours) at 05/05/2021 1710 Last data filed at 05/05/2021 1704 Gross per 24 hour  Intake 2881.23 ml  Output 550 ml  Net 2331.23 ml   Filed Weights   05/02/21 1614 05/02/21 2032  Weight: 62.1 kg 62.5 kg    Examination:  Awake coherent looks slightly better Multiple tattoos Chest clear no added sound Left CVA tenderness Slight abdominal discomfort No lower extremity edema Neurologically intact no focal deficit  Data Reviewed: personally reviewed   CBC    Component Value Date/Time   WBC 6.1 05/05/2021 0450   RBC 3.30 (L) 05/05/2021 0450   HGB 10.2 (L) 05/05/2021 0450   HGB 12.8 05/01/2021 1040   HCT 29.3 (L) 05/05/2021 0450   HCT 38.2 05/01/2021 1040   PLT 205 05/05/2021 0450   PLT 333 05/01/2021 1040   MCV 88.8 05/05/2021 0450   MCV 90 05/01/2021 1040   MCH 30.9 05/05/2021 0450   MCHC 34.8 05/05/2021 0450   RDW 12.6 05/05/2021 0450   RDW 12.9  05/01/2021 1040   LYMPHSABS 1.5 05/05/2021 0450   LYMPHSABS 2.3 05/01/2021 1040   MONOABS 0.7 05/05/2021 0450   EOSABS 0.2 05/05/2021 0450   EOSABS 0.1 05/01/2021 1040   BASOSABS 0.0 05/05/2021 0450   BASOSABS 0.1 05/01/2021 1040   CMP Latest Ref Rng & Units 05/05/2021 05/04/2021 05/03/2021  Glucose 70 - 99 mg/dL 83 86 98  BUN 6 - 20 mg/dL 7 6 6   Creatinine 0.44 - 1.00 mg/dL 3.71 6.96  Sodium 135 - 145 mmol/L 136 138 136  Potassium 3.5 - 5.1 mmol/L 3.6 3.4(L) 3.8  Chloride 98 - 111 mmol/L  106 109 105  CO2 22 - 32 mmol/L 22 22 22   Calcium 8.9 - 10.3 mg/dL 7.89) ) 9.0  Total Protein 6.5 - 8.1 g/dL 3.8(B) 5.9(L) -  Total Bilirubin 0.3 - 1.2 mg/dL 0.3 0.4 -  Alkaline Phos 38 - 126 U/L 54 52 -  AST 15 - 41 U/L 19 19 -  ALT 0 - 44 U/L 12 14 -     Radiology Studies: No results found.   Scheduled Meds: . cefdinir  300 mg Oral Q12H  . ibuprofen  600 mg Oral TID   Continuous Infusions:    LOS: 3 days   Time spent: 26  5.1(W, MD [facility] To contact the attending provider between 7A-7P or the covering provider during after hours 7P-7A, please log into the web site [email or web address] and access using universal [place] password for that web site. If you do not have the password, please call the hospital operator.  05/05/2021, 5:10 PM

## 2021-05-05 NOTE — TOC Initial Note (Signed)
Transition of Care [facility]) - Initial/Assessment Note    Patient Details  Name: Sherri Vasquez MRN: [id number] Date of Birth: [birth year]  Transition of Care [facility]) CM/SW Contact:    Luvenia Redden, RN Phone Number: [phone] 05/05/2021, 5:29 PM  Clinical Narrative:                 Pt does not have a payor sources and needs antibiotics upon an anticipated discharged this weekend. Pt pregnant and received care from [facility]. Pt having finical hardship and will need medication assistance. Introduced medication management near the hospital for future sources of medication assistance however not available over the weekend. Will provider medication assistance through the [facility] dept for assistance with the recommended antibiotics upon her discharge. Explained the program for [facility] and delivered the prescription card to the pt at bedside. Explained the use within the 7 days noted on the card and pt has chosen [employer] in [place]. TOC RN has requested the provider call in needed prescriptions as discussed today on progression rounds in preparation for pt's discharge. No further needs at this time.  Pt lives in a mobile home with her children and has sufficient transportation for discharge and to all her medical appointments. No other needs presented at this time.   TOC remains available to assist with any other needs.   Expected Discharge Plan: Home/Self Care Barriers to Discharge: Barriers Resolved   Patient Goals and CMS Choice        Expected Discharge Plan and Services Expected Discharge Plan: Home/Self Care   Discharge Planning Services: CM Consult   Living arrangements for the past 2 months: Apartment                                      Prior Living Arrangements/Services Living arrangements for the past 2 months: Apartment Lives with:: Minor Children Patient language and need for interpreter reviewed:: Yes Do  you feel safe going back to the place where you live?: Yes      Need for Family Participation in Patient Care: Yes (Comment) Care giver support system in place?: Yes (comment)   Criminal Activity/Legal Involvement Pertinent to Current Situation/Hospitalization: No - Comment as needed  Activities of Daily Living Home Assistive Devices/Equipment: None ADL Screening (condition at time of admission) Patient's cognitive ability adequate to safely complete daily activities?: Yes Is the patient deaf or have difficulty hearing?: No Does the patient have difficulty seeing, even when wearing glasses/contacts?: No Does the patient have difficulty concentrating, remembering, or making decisions?: No Patient able to express need for assistance with ADLs?: Yes Does the patient have difficulty dressing or bathing?: No Independently performs ADLs?: Yes (appropriate for developmental age) Does the patient have difficulty walking or climbing stairs?: No Weakness of Legs: None Weakness of Arms/Hands: None  Permission Sought/Granted   Permission granted to share information with : Yes, Verbal Permission Granted              Emotional Assessment Appearance:: Appears stated age Attitude/Demeanor/Rapport: Engaged Affect (typically observed): Accepting Orientation: : Oriented to Self,Oriented to Place,Oriented to  Time,Oriented to Situation Alcohol / Substance Use: Not Applicable    Admission diagnosis:  Left flank pain [R10.9] Acute left flank pain [R10.9] Acute right flank pain [R10.9] Acute pyelonephritis [N10] Pyelonephritis affecting pregnancy in first trimester [O23.01] Abdominal pain during pregnancy in first trimester [O26.891, R10.9]  Currently pregnant in first trimester with unknown gestational age [Z85.91] Patient Active Problem List   Diagnosis Date Noted  . Abnormal Pap smear of cervix 05/01/21 05/04/2021  . Acute pyelonephritis 05/02/2021  . Nephrolithiasis 05/02/2021  . First  trimester pregnancy 05/02/2021  . Low birth weight infant x2 at term (06/09/2015 5 lbs, 03/24/2011 5 lbs) 05/01/2021  . Grand multipara G7P6 05/01/2021  . Supervision of high risk pregnancy in first trimester 05/01/2021  . Elevated blood pressure affecting pregnancy in first trimester, antepartum 05/01/2021  . Poor historian 05/01/2021   PCP:  Patient, No Pcp Per (Inactive) Pharmacy:   [facility] [phone] - [place], [place] - [address] [place] [address] Phone: [phone] Fax: [phone]     Social Determinants of Health (SDOH) Interventions    Readmission Risk Interventions No flowsheet data found.

## 2021-05-05 NOTE — Plan of Care (Signed)
Continuing with plan of care. 

## 2021-05-05 NOTE — Plan of Care (Signed)
Pt c/o of HA overnight but relieved with Ibuprofen and pt went to sleep after getting a sleeping pill. Vitals stable. Will continue to monitor.  Problem: Education: Goal: Knowledge of General Education information will improve Description: Including pain rating scale, medication(s)/side effects and non-pharmacologic comfort measures Outcome: Progressing   Problem: Health Behavior/Discharge Planning: Goal: Ability to manage health-related needs will improve Outcome: Progressing   Problem: Clinical Measurements: Goal: Ability to maintain clinical measurements within normal limits will improve Outcome: Progressing Goal: Will remain free from infection Outcome: Progressing Goal: Diagnostic test results will improve Outcome: Progressing Goal: Respiratory complications will improve Outcome: Progressing Goal: Cardiovascular complication will be avoided Outcome: Progressing   Problem: Activity: Goal: Risk for activity intolerance will decrease Outcome: Progressing   Problem: Nutrition: Goal: Adequate nutrition will be maintained Outcome: Progressing   Problem: Coping: Goal: Level of anxiety will decrease Outcome: Progressing   Problem: Coping: Goal: Level of anxiety will decrease Outcome: Progressing   Problem: Coping: Goal: Level of anxiety will decrease Outcome: Progressing   Problem: Elimination: Goal: Will not experience complications related to bowel motility Outcome: Progressing Goal: Will not experience complications related to urinary retention Outcome: Progressing   Problem: Elimination: Goal: Will not experience complications related to bowel motility Outcome: Progressing Goal: Will not experience complications related to urinary retention Outcome: Progressing   Problem: Pain Managment: Goal: General experience of comfort will improve Outcome: Progressing   Problem: Safety: Goal: Ability to remain free from injury will improve Outcome: Progressing    Problem: Skin Integrity: Goal: Risk for impaired skin integrity will decrease Outcome: Progressing

## 2021-05-06 ENCOUNTER — Encounter: Payer: Self-pay | Admitting: Family Medicine

## 2021-05-06 DIAGNOSIS — R109 Unspecified abdominal pain: Secondary | ICD-10-CM

## 2021-05-06 LAB — COMPREHENSIVE METABOLIC PANEL
ALT: 14 U/L (ref 0–44)
AST: 21 U/L (ref 15–41)
Albumin: 2.9 g/dL — ABNORMAL LOW (ref 3.5–5.0)
Alkaline Phosphatase: 71 U/L (ref 38–126)
Anion gap: 9 (ref 5–15)
BUN: 8 mg/dL (ref 6–20)
CO2: 23 mmol/L (ref 22–32)
Calcium: 8.6 mg/dL — ABNORMAL LOW (ref 8.9–10.3)
Chloride: 106 mmol/L (ref 98–111)
Creatinine, Ser: 0.64 mg/dL (ref 0.44–1.00)
GFR, Estimated: >60 mL/min (ref >60)
Glucose, Bld: 82 mg/dL (ref 70–99)
Potassium: 3.6 mmol/L (ref 3.5–5.1)
Sodium: 138 mmol/L (ref 135–145)
Total Bilirubin: 0.3 mg/dL (ref 0.3–1.2)
Total Protein: 6.3 g/dL — ABNORMAL LOW (ref 6.5–8.1)

## 2021-05-06 LAB — CBC WITH DIFFERENTIAL/PLATELET
Abs Immature Granulocytes: 0.03 10*3/uL (ref 0.00–0.07)
Basophils Absolute: 0 10*3/uL (ref 0.0–0.1)
Basophils Relative: 0 %
Eosinophils Absolute: 0.2 10*3/uL (ref 0.0–0.5)
Eosinophils Relative: 3 %
HCT: 30.5 % — ABNORMAL LOW (ref 36.0–46.0)
Hemoglobin: 10.5 g/dL — ABNORMAL LOW (ref 12.0–15.0)
Immature Granulocytes: 1 %
Lymphocytes Relative: 39 %
Lymphs Abs: 1.9 10*3/uL (ref 0.7–4.0)
MCH: 30.3 pg (ref 26.0–34.0)
MCHC: 34.4 g/dL (ref 30.0–36.0)
MCV: 88.2 fL (ref 80.0–100.0)
Monocytes Absolute: 0.5 10*3/uL (ref 0.1–1.0)
Monocytes Relative: 11 %
Neutro Abs: 2.3 10*3/uL (ref 1.7–7.7)
Neutrophils Relative %: 46 %
Platelets: 250 10*3/uL (ref 150–400)
RBC: 3.46 MIL/uL — ABNORMAL LOW (ref 3.87–5.11)
RDW: 12.5 % (ref 11.5–15.5)
WBC: 5 10*3/uL (ref 4.0–10.5)
nRBC: 0 % (ref 0.0–0.2)

## 2021-05-06 MED ORDER — DOXYLAMINE SUCCINATE (SLEEP) 25 MG PO TABS: 25.0000 mg | ORAL_TABLET | Freq: Every evening | ORAL | 0 refills | Status: DC | PRN

## 2021-05-06 MED ORDER — HYDROCODONE-ACETAMINOPHEN 5-325 MG PO TABS: 1.0000 | ORAL_TABLET | ORAL | 0 refills | Status: DC | PRN

## 2021-05-06 MED ORDER — CEFDINIR 300 MG PO CAPS: 300.0000 mg | ORAL_CAPSULE | Freq: Two times a day (BID) | ORAL | 0 refills | Status: DC

## 2021-05-06 MED ORDER — IBUPROFEN 800 MG PO TABS: 800.0000 mg | ORAL_TABLET | Freq: Three times a day (TID) | ORAL | 0 refills | Status: DC | PRN

## 2021-05-06 NOTE — Plan of Care (Signed)
Continuing with plan of care. 

## 2021-05-06 NOTE — Progress Notes (Signed)
Sherri Vasquez to be D/C'd Home per MD order.  Discussed prescriptions and follow up appointments with the patient. Prescriptions given to patient, medication list explained in detail. Pt verbalized understanding.  Allergies as of 05/06/2021   No Known Allergies     Medication List    STOP taking these medications   ibuprofen 800 MG tablet Commonly known as: ADVIL   metoCLOPramide 5 MG tablet Commonly known as: Reglan     TAKE these medications   cefdinir 300 MG capsule Commonly known as: OMNICEF Take 1 capsule (300 mg total) by mouth every 12 (twelve) hours.   doxylamine (Sleep) 25 MG tablet Commonly known as: UNISOM Take 1 tablet (25 mg total) by mouth at bedtime as needed for sleep.   HYDROcodone-acetaminophen 5-325 MG tablet Commonly known as: Norco Take 1 tablet by mouth every 4 (four) hours as needed for severe pain. What changed: reasons to take this   multivitamin-prenatal 27-0.8 MG Tabs tablet Take 1 tablet by mouth daily at 12 noon.       Vitals:   05/06/21 0415 05/06/21 1151  BP: 118/70 124/83  Pulse: 70 80  Resp: 16 18  Temp: 98.8 F (37.1 C) 99.1 F (37.3 C)  SpO2: 100% 100%    Skin clean, dry and intact without evidence of skin break down, no evidence of skin tears noted. IV catheter discontinued intact. Site without signs and symptoms of complications. Dressing and pressure applied. Pt denies pain at this time. No complaints noted.  An After Visit Summary was printed and given to the patient. Patient escorted via WC, and D/C home via private auto.  Rigoberto Noel

## 2021-05-06 NOTE — Discharge Summary (Signed)
Physician Discharge Summary  Sherri Vasquez ZOX:[id number] DOB: 11-Jan-1991 DOA: 05/02/2021  PCP: Patient, No Pcp Per (Inactive)  Admit date: 05/02/2021 Discharge date: 05/06/2021  Time spent: 37 minutes  Recommendations for Outpatient Follow-up:  1. Complete Omnicef duration for probable Pilo 2. Outpatient follow-up with urologist regarding kidney stones 3. Prescribed limited amount of oxycodone for severe pain/headaches 4. Work note given 5. Needs to follow-up at either Dr. Rosilyn MingsBethany Beasley's office or at [facility] for further routine prenatal care  Discharge Diagnoses:  MAIN problem for hospitalization   Sepsis on admission secondary to pyelonephritis Right nephrolithiasis with kidney stone being passed  Please see below for itemized issues addressed in HOpsital- refer to other progress notes for clarity if needed  Discharge Condition: Improved  Diet recommendation: Heart healthy  Filed Weights   05/02/21 1614 05/02/21 2032  Weight: 62.1 kg 62.5 kg    History of present illness:  30 year old G7 P6-0-0-6, LMP 02/03/2021 Not yet established with OB/GYN Prior left ankle fracture 03/14/2021 ?  Prior kidney stones High risk pregnancy with prior low birthweight infants at term --Non-smoker currently-occasional EtOH-seventh grade education-(see excellent note from 05/01/2021 Mrs. Arnetha Courserlizabeth Sciora)  Loin to groin pain on admission, HR 100 range--found to have meet sepsis criteria Renal ultrasound =?  Pyelonephritis Started IV Rocephin  urologist Dr. Apolinar JunesBrandon [facility] Dr. Dalbert GarnetBeasley consulted  Blood cultures obtained 5/12 Urine culture obtained 5/11 growing E. coli which eventually was found to be sensitive to cephalosporins Wet prep =  WBC  Hospital Course:  Acute pyelonephritis likely to E. coli Patient hospitalized for 5 [place] in [place] in 2019 with similar current [facility]--DC IV fluid Less  nausea-improving slowly Pain was well managed prior to discharge with oxycodone I have counseled her to use Tylenol given she is pregnant mainly around-the-clock and then can use Percocet instead Urolithiasis Patient passed a kidney stone and has a history of the same which is probably the source of her Pilo She did not get a CT during this hospitalization because of her early pregnancy state She will need outpatient follow-up with urologist in the outpatient setting if this recurs We will forward to urology for outpatient care coordination Headache unlikely PIH Tylenol high-dose round-the-clock for headaches (and preference to ibuprofen as she is pregnant) Percocet ordered I believe that she has caffeine withdrawal versus hypoglycemic versus tension/migraine headaches given she has light sensitivity I think that this will improve when she is discharged Grand multiparity currently around [redacted] weeks gestation Appreciate OB input High risk pregnancy (small for dates babies X2, gestational hypertension last pregnancy) Outpatient follow-up per Dr. Dalbert GarnetBeasley and or [facility]  Procedures: OB ultrasound renal ultrasound showing may be pyelonephritis Consultations:  Jethro PolingSamantha N. Vaillancourt, PA with urology  Christeen DouglasBethany Beasley, MD OB/GYN  Discharge Exam: Vitals:   05/06/21 0022 05/06/21 0415  BP: 115/72 118/70  Pulse: 72 70  Resp: 18 16  Temp: 98.7 F (37.1 C) 98.8 F (37.1 C)  SpO2: 100% 100%    Subj on day of d/c   Feels better still headache passed a stone this morning No chest pain no fever  Eating and drinking passing urine  General Exam on discharge  EOMI NCAT comfortable appearing female no distress chest clear no added sound no rales no rhonchi abdomen soft Decreased flank tenderness on the left No lower extremity edema Neurologically intact  Discharge Instructions   Discharge Instructions    Diet - low sodium heart  healthy   Complete by: As directed     Discharge instructions   Complete by: As directed    Please finish all of the antibiotics as this will prevent you from having of infection with an upper urinary infection. Take Tylenol 1000 mg every 6 hours as first choice for pain and you can use hydrocodone as second choice for severe pain and limited amount prescribed we have given you prescription for all your meds Unisom can be used to help you sleep-I do not think your headaches are related to anything other than you probably not eating as much as you normally do and may be caffeine withdrawal at this time-if they persist follow-up at the health department You will get a work note on discharge to excuse you from work for the days that you were sick and you can return to work on Wednesday of this week Good luck please make sure you follow-up   Increase activity slowly   Complete by: As directed      Allergies as of 05/06/2021   No Known Allergies     Medication List    STOP taking these medications   ibuprofen 800 MG tablet Commonly known as: ADVIL   metoCLOPramide 5 MG tablet Commonly known as: Reglan     TAKE these medications   cefdinir 300 MG capsule Commonly known as: OMNICEF Take 1 capsule (300 mg total) by mouth every 12 (twelve) hours.   doxylamine (Sleep) 25 MG tablet Commonly known as: UNISOM Take 1 tablet (25 mg total) by mouth at bedtime as needed for sleep.   HYDROcodone-acetaminophen 5-325 MG tablet Commonly known as: Norco Take 1 tablet by mouth every 4 (four) hours as needed for severe pain. What changed: reasons to take this   multivitamin-prenatal 27-0.8 MG Tabs tablet Take 1 tablet by mouth daily at 12 noon.      No Known Allergies    The results of significant diagnostics from this hospitalization (including imaging, microbiology, ancillary and laboratory) are listed below for reference.    Significant Diagnostic Studies: US OB Comp Less 14 Wks  Result Date: 05/02/2021 CLINICAL DATA:   Pelvic pain EXAM: OBSTETRIC <14 WK ULTRASOUND TECHNIQUE: Transabdominal ultrasound was performed for evaluation of the gestation as well as the maternal uterus and adnexal regions. COMPARISON:  None. FINDINGS: Intrauterine gestational sac: Visualized-single Yolk sac:  Not visualized Embryo:  Visualized Cardiac Activity: Visualized Heart Rate: 167 bpm CRL:   41 mm   11 w 0 d                  [place] EDC: November 21, 2021 Subchorionic hemorrhage:  None visualized. Maternal uterus/adnexae: Cervical os closed. Right ovary measures 3.3 x 1.6 x 1.8 cm. Left ovary measures 2.4 x 2.1 x 1.7 cm. No extrauterine pelvic mass or free fluid. IMPRESSION: Single live intrauterine gestation with estimated gestational age of [redacted] weeks. No evidence of chorionic hemorrhage. No extrauterine pelvic mass or fluid. Electronically Signed   By: Bretta Bang III M.D.   On: 05/02/2021 18:49   US Renal  Result Date: 05/02/2021 CLINICAL DATA:  Flank pain EXAM: RENAL / URINARY TRACT ULTRASOUND COMPLETE COMPARISON:  None. FINDINGS: Right Kidney: Renal measurements: 10.9 x 4.5 x 5.1 cm = volume: 132 mL. Echogenicity and renal cortical thickness are within normal limits. No mass, perinephric fluid, or hydronephrosis visualized. No sonographically demonstrable calculus or ureterectasis Left Kidney: Renal measurements: 13.0 x 6.9 x 6.3 cm = volume: 293 mL. Echogenicity and renal  cortical thickness are within normal limits. No mass or perinephric fluid visualized. Mild fullness of the left renal collecting system noted. No sonographically demonstrable calculus or ureterectasis. Left kidney appears subtly edematous. Bladder: Appears normal for degree of bladder distention. Other: None. IMPRESSION: Left kidney appears subtly edematous with fullness of the left renal collecting system. No ureterectasis. Question a degree of pyelonephritis. No perinephric fluid or renal abscess. Study otherwise unremarkable. Electronically Signed   By: Bretta Bang III M.D.   On: 05/02/2021 18:51    Microbiology: Recent Results (from the past 240 hour(s))  Chlamydia/GC NAA, Confirmation     Status: None   Collection Time: 05/01/21 10:30 AM   Specimen: Vaginal   VA  Result Value Ref Range Status   Chlamydia trachomatis, NAA Negative Negative Final   Neisseria gonorrhoeae, NAA Negative Negative Final  WET PREP FOR TRICH, YEAST, CLUE     Status: None   Collection Time: 05/01/21 11:11 AM  Result Value Ref Range Status   Trichomonas Exam Negative Negative Final   Yeast Exam Negative Negative Final   Clue Cell Exam Comment: Negative Final    Comment: NEG;AMINE NEG  Urine culture     Status: Abnormal   Collection Time: 05/02/21  4:51 PM   Specimen: Urine, Random  Result Value Ref Range Status   Specimen Description   Final    URINE, RANDOM Performed at [facility], [address] [place], [place] [address]    Special Requests   Final    NONE Performed at [facility], [address] [place], [place] [address]    Culture >=100,000 COLONIES/mL ESCHERICHIA COLI (A)  Final   Report Status 05/05/2021 FINAL  Final   Organism ID, Bacteria ESCHERICHIA COLI (A)  Final      Susceptibility   Escherichia coli - MIC*    AMPICILLIN >=32 RESISTANT Resistant     CEFAZOLIN <=4 SENSITIVE Sensitive     CEFEPIME <=0.12 SENSITIVE Sensitive     CEFTRIAXONE <=0.25 SENSITIVE Sensitive     CIPROFLOXACIN <=0.25 SENSITIVE Sensitive     GENTAMICIN <=1 SENSITIVE Sensitive     IMIPENEM <=0.25 SENSITIVE Sensitive     NITROFURANTOIN <=16 SENSITIVE Sensitive     TRIMETH/SULFA >=320 RESISTANT Resistant     AMPICILLIN/SULBACTAM >=32 RESISTANT Resistant     PIP/TAZO <=4 SENSITIVE Sensitive     * >=100,000 COLONIES/mL ESCHERICHIA COLI  Wet prep, genital     Status: Abnormal   Collection Time: 05/02/21  5:06 PM   Specimen: Vaginal  Result Value Ref Range Status   Yeast Wet Prep HPF POC NONE SEEN NONE SEEN Final   Trich, Wet Prep NONE  SEEN NONE SEEN Final   Clue Cells Wet Prep HPF POC NONE SEEN NONE SEEN Final   WBC, Wet Prep HPF POC FEW (A) NONE SEEN Final   Sperm NONE SEEN  Final    Comment: Performed at [facility], [address] [place], [place] [address]  Chlamydia/NGC rt PCR [facility] only)     Status: None   Collection Time: 05/02/21  5:06 PM  Result Value Ref Range Status   Specimen source GC/Chlam ENDO  Final   Chlamydia Tr NOT DETECTED NOT DETECTED Final   N gonorrhoeae NOT DETECTED NOT DETECTED Final    Comment: (NOTE) This CT/NG assay has not been evaluated in patients with a history of  hysterectomy. Performed at [facility], [address] [place], [place] [address]   Resp Panel by  RT-PCR (Flu A&B, Covid) Nasopharyngeal Swab     Status: None   Collection Time: 05/02/21  7:39 PM   Specimen: Nasopharyngeal Swab; Nasopharyngeal(NP) swabs in vial transport medium  Result Value Ref Range Status   SARS Coronavirus 2 by RT PCR NEGATIVE NEGATIVE Final    Comment: (NOTE) SARS-CoV-2 target nucleic acids are NOT DETECTED.  The SARS-CoV-2 RNA is generally detectable in upper respiratory specimens during the acute phase of infection. The lowest concentration of SARS-CoV-2 viral copies this assay can detect is 138 copies/mL. A negative result does not preclude SARS-Cov-2 infection and should not be used as the sole basis for treatment or other patient management decisions. A negative result may occur with  improper specimen collection/handling, submission of specimen other than nasopharyngeal swab, presence of viral mutation(s) within the areas targeted by this assay, and inadequate number of viral copies(<138 copies/mL). A negative result must be combined with clinical observations, patient history, and epidemiological information. The expected result is Negative.  Fact Sheet for Patients:  BloggerCourse.com  Fact Sheet for Healthcare Providers:   SeriousBroker.it  This test is no t yet approved or cleared by the [place] FDA and  has been authorized for detection and/or diagnosis of SARS-CoV-2 by FDA under an Emergency Use Authorization (EUA). This EUA will remain  in effect (meaning this test can be used) for the duration of the COVID-19 declaration under Section 564(b)(1) of the Act, 21 U.S.C.section 360bbb-3(b)(1), unless the authorization is terminated  or revoked sooner.       Influenza A by PCR NEGATIVE NEGATIVE Final   Influenza B by PCR NEGATIVE NEGATIVE Final    Comment: (NOTE) The Xpert Xpress SARS-CoV-2/FLU/RSV plus assay is intended as an aid in the diagnosis of influenza from Nasopharyngeal swab specimens and should not be used as a sole basis for treatment. Nasal washings and aspirates are unacceptable for Xpert Xpress SARS-CoV-2/FLU/RSV testing.  Fact Sheet for Patients: BloggerCourse.com  Fact Sheet for Healthcare Providers: SeriousBroker.it  This test is not yet approved or cleared by the [place] FDA and has been authorized for detection and/or diagnosis of SARS-CoV-2 by FDA under an Emergency Use Authorization (EUA). This EUA will remain in effect (meaning this test can be used) for the duration of the COVID-19 declaration under Section 564(b)(1) of the Act, 21 U.S.C. section 360bbb-3(b)(1), unless the authorization is terminated or revoked.  Performed at [facility], [address] [place], [place] [address]   CULTURE, BLOOD (ROUTINE X 2) w Reflex to ID Panel     Status: None (Preliminary result)   Collection Time: 05/03/21  3:56 AM   Specimen: BLOOD  Result Value Ref Range Status   Specimen Description BLOOD LEFT ANTECUBITAL  Final   Special Requests   Final    BOTTLES DRAWN AEROBIC AND ANAEROBIC Blood Culture adequate volume   Culture   Final    NO GROWTH 3 DAYS Performed at [facility], [address] [place], [place] [address]    Report Status PENDING  Incomplete  CULTURE, BLOOD (ROUTINE X 2) w Reflex to ID Panel     Status: None (Preliminary result)   Collection Time: 05/03/21  4:04 AM   Specimen: BLOOD  Result Value Ref Range Status   Specimen Description BLOOD BLOOD LEFT WRIST  Final   Special Requests   Final    BOTTLES DRAWN AEROBIC ONLY Blood Culture adequate volume   Culture   Final    NO GROWTH 3 DAYS Performed at  [facility], [address] [place], [place] [address]    Report Status PENDING  Incomplete     Labs: Basic Metabolic Panel: Recent Labs  Lab 05/02/21 1617 05/03/21 0250 05/04/21 0534 05/05/21 0450 05/06/21 0510  NA 136 136 138 136 138  K 3.5 3.8 3.4* 3.6 3.6  CL 105 105 109 106 106  CO2 21* 22 22 22 23   GLUCOSE 117* 98 86 83 82  BUN 6 6 6 7 8   CREATININE 0.55 0.66 0.94 0.85 0.64  CALCIUM 9.3 9.0 8.4* 8.4* 8.6*   Liver Function Tests: Recent Labs  Lab 05/04/21 0534 05/05/21 0450 05/06/21 0510  AST 19 19 21   ALT 14 12 14   ALKPHOS 52 54 71  BILITOT 0.4 0.3 0.3  PROT 5.9* 5.8* 6.3*  ALBUMIN 2.8* 2.8* 2.9*   No results for input(s): LIPASE, AMYLASE in the last 168 hours. No results for input(s): AMMONIA in the last 168 hours. CBC: Recent Labs  Lab 05/01/21 1040 05/02/21 1617 05/03/21 0250 05/04/21 0534 05/05/21 0450 05/06/21 0510  WBC 9.0 11.4* 12.6* 8.2 6.1 5.0  NEUTROABS 6.1  --   --  6.2 3.7 2.3  HGB 12.8 12.6 11.6* 11.1* 10.2* 10.5*  HCT 38.2 36.1 33.8* 31.4* 29.3* 30.5*  MCV 90 87.4 88.0 89.0 88.8 88.2  PLT 333 296 244 209 205 250   Cardiac Enzymes: No results for input(s): CKTOTAL, CKMB, CKMBINDEX, TROPONINI in the last 168 hours. BNP: BNP (last 3 results) No results for input(s): BNP in the last 8760 hours.  ProBNP (last 3 results) No results for input(s): PROBNP in the last 8760 hours.  CBG: No results for input(s): GLUCAP in the last 168 hours.     Signed:  07/03/21 MD   [facility] 05/06/2021, 9:36 AM

## 2021-05-07 DIAGNOSIS — Z2839 Other underimmunization status: Secondary | ICD-10-CM | POA: Insufficient documentation

## 2021-05-07 DIAGNOSIS — O09899 Supervision of other high risk pregnancies, unspecified trimester: Secondary | ICD-10-CM | POA: Insufficient documentation

## 2021-05-07 LAB — URINE CULTURE

## 2021-05-08 ENCOUNTER — Encounter: Payer: Self-pay | Admitting: Advanced Practice Midwife

## 2021-05-08 DIAGNOSIS — O2340 Unspecified infection of urinary tract in pregnancy, unspecified trimester: Secondary | ICD-10-CM | POA: Insufficient documentation

## 2021-05-08 DIAGNOSIS — Z87448 Personal history of other diseases of urinary system: Secondary | ICD-10-CM | POA: Insufficient documentation

## 2021-05-08 LAB — CULTURE, BLOOD (ROUTINE X 2)
Culture: NO GROWTH
Culture: NO GROWTH
Special Requests: ADEQUATE
Special Requests: ADEQUATE

## 2021-05-23 ENCOUNTER — Telehealth: Payer: Self-pay

## 2021-05-23 ENCOUNTER — Ambulatory Visit
Admission: RE | Admit: 2021-05-23 | Discharge: 2021-05-23 | Disposition: A | Discharge status: Home / Self Care | Payer: Self-pay | Source: Ambulatory Visit | Attending: Advanced Practice Midwife | Admitting: Advanced Practice Midwife

## 2021-05-23 ENCOUNTER — Other Ambulatory Visit: Payer: Self-pay

## 2021-05-23 DIAGNOSIS — Z641 Problems related to multiparity: Secondary | ICD-10-CM

## 2021-05-23 NOTE — Telephone Encounter (Signed)
Client with [facility] IP appt 05/01/2021 and dating [place] ordered by E. Sciora CNM and was scheduled for 05/23/2021. 05/02/2021 client presented to [facility] ED for evaluation of flank pain and US done at that time which dated client at [facility] = 11 weeks. Sonographer called to notify clinic no need to repeat  Sonographer explained to client who she states understood. Client has [facility] RV appt in next week. Hazle Coca CNM notified verbally of above. Jossie Ng, RN

## 2021-05-29 ENCOUNTER — Other Ambulatory Visit: Payer: Self-pay

## 2021-05-29 ENCOUNTER — Ambulatory Visit: Payer: Medicaid Other | Admitting: Family Medicine

## 2021-05-29 VITALS — BP 107/70 | HR 103 | Temp 98.5°F | Wt 130.8 lb

## 2021-05-29 DIAGNOSIS — O0991 Supervision of high risk pregnancy, unspecified, first trimester: Secondary | ICD-10-CM

## 2021-05-29 DIAGNOSIS — O0992 Supervision of high risk pregnancy, unspecified, second trimester: Secondary | ICD-10-CM | POA: Diagnosis not present

## 2021-05-29 DIAGNOSIS — O2342 Unspecified infection of urinary tract in pregnancy, second trimester: Secondary | ICD-10-CM | POA: Diagnosis not present

## 2021-05-29 DIAGNOSIS — O2341 Unspecified infection of urinary tract in pregnancy, first trimester: Secondary | ICD-10-CM

## 2021-05-29 NOTE — Progress Notes (Addendum)
Reports completed Omnicef - urine cx today. ROI for L & D note / discharge summary signed and faxed with confirmation received to [facility] (5 # infants x2). Desires Quad screen today. Jossie Ng, RN Per fax from [facility], there are o medical records for requested date of service during 03/2011. Records received for 05/2015 pregnancy labeled and sent for scanning. Jossie Ng, RN

## 2021-05-30 NOTE — Progress Notes (Addendum)
   PRENATAL VISIT NOTE  Subjective:  Sherri Vasquez is a 30 y.o. G7P6006 at [redacted]w[redacted]d being seen today for ongoing prenatal care.  She is currently monitored for the following issues for this high-risk pregnancy and has Low birth weight infant x2 at term (06/09/2015 5 lbs, 03/24/2011 5 lbs); Grand multipara G7P6; Supervision of high risk pregnancy in first trimester; Elevated blood pressure affecting pregnancy in first trimester, antepartum; Poor historian; Acute pyelonephritis 05/02/21 with hospitalization IV Rocephin; Nephrolithiasis 05/02/21; First trimester pregnancy; Abnormal Pap smear of cervix 05/01/21; Susceptible to varicella (non-immune), currently pregnant; UTI (urinary tract infection) during pregnancy 05/01/21 >100,000 E. Coli; and History of pyelonephritis 2019 with hospitalization in [place] x 5 days on their problem list.  Patient reports backache.  Contractions: Not present. Vag. Bleeding: None.  Movement: Absent. Denies leaking of fluid/ROM.   The following portions of the patient's history were reviewed and updated as appropriate: allergies, current medications, past family history, past medical history, past social history, past surgical history and problem list. Problem list updated.  Objective:   Vitals:   05/29/21 1323  BP: 107/70  Pulse: (!) 103  Temp: 98.5 F (36.9 C)  Weight: 130 lb 12.8 oz (59.3 kg)    Fetal Status: Fetal Heart Rate (bpm): 152 Fundal Height: 15 cm Movement: Absent     General:  Alert, oriented and cooperative. Patient is in no acute distress.  Skin: Skin is warm and dry. No rash noted.   Cardiovascular: Normal heart rate noted  Respiratory: Normal respiratory effort, no problems with respiration noted  Abdomen: Soft, gravid, appropriate for gestational age.  Pain/Pressure: Absent     Pelvic: Cervical exam deferred        Extremities: Normal range of motion.  Edema: None  Mental Status: Normal mood and affect. Normal behavior. Normal judgment and  thought content.   Assessment and Plan:  Pregnancy: G7P6006 at [redacted]w[redacted]d  1. Urinary tract infection in mother during first trimester of pregnancy TOC from 05/02/21 ED visit  - Urine Culture  2. Supervision of high risk pregnancy in second trimester -discussed with patient about ED visit on 05/02/21 for nephrolithiasis, reports occasional Left flank  pain, denies, n/v or intensity as when went to ED.   -Reports followed up with ED  -reports only nausea was with pyelnonephritis.   -06/02/21 U/S was cancelled d/t 05/02/21 [place], will order anatomy U/S  - Pt has no concerns about pregnancy.  -discussed with pt if s/sx were to reappear to Return to ED.   -discussed diet and exercise.  -anatomy u/s ordered    - AFP TETRA - Urine Culture   Preterm labor symptoms and general obstetric precautions including but not limited to vaginal bleeding, contractions, leaking of fluid and fetal movement were reviewed in detail with the patient. Please refer to After Visit Summary for other counseling recommendations.  Return in about 4 weeks (around 06/26/2021) for routine prenatal care.  Future Appointments  Date Time Provider Department Center  06/27/2021  2:00 PM [facility] PROVIDER [facility] None   M. [place] used for [place] interpretation.     Wendi Snipes, FNP

## 2021-05-31 LAB — AFP TETRA
DIA Mom Value: 1.11
DIA Value (EIA): 187.46 pg/mL
DSR (By Age)    1 IN: 502
DSR (Second Trimester) 1 IN: 281
Gestational Age: 16.1 WEEKS
MSAFP Mom: 0.6
MSAFP: 23.1 ng/mL
MSHCG Mom: 1
MSHCG: 45037 m[IU]/mL
Maternal Age At EDD: 32.3 yr
Osb Risk: 10000
T18 (By Age): 1:1954 {titer}
Test Results:: NEGATIVE
Weight: 131 [lb_av]
uE3 Mom: 0.41
uE3 Value: 0.4 ng/mL

## 2021-06-01 LAB — URINE CULTURE: Organism ID, Bacteria: NO GROWTH

## 2021-06-01 NOTE — Progress Notes (Signed)
Due to high risk code, unable to schedule anatomy US at [facility]. [facility] referral completed by Elveria Rising FNP-BC and faxed with snapshot pages. Fax confirmation received. RN will call scheduler for appt next week. Jossie Ng, RN

## 2021-06-01 NOTE — Addendum Note (Signed)
Addended by: Wendi Snipes on: 06/01/2021 05:03 PM   Modules accepted: Orders

## 2021-06-04 ENCOUNTER — Encounter: Payer: Self-pay | Admitting: Family Medicine

## 2021-06-05 ENCOUNTER — Telehealth: Payer: Self-pay

## 2021-06-05 NOTE — Telephone Encounter (Signed)
Call to client to verify aware of [facility] anatomy US appt on 07/12/2021 at 1 pm with arrival time of 12:45 pm. Per recorded message, voicemail box is full. Call to emergency contact (spouse) and left [place] appt info and request to contact client to call [facility] to verify aware of appt and appt facility location. Number to call provided. Jossie Ng, RN

## 2021-06-05 NOTE — Addendum Note (Signed)
Addended by: Heywood Bene on: 06/05/2021 11:47 AM   Modules accepted: Orders

## 2021-06-06 ENCOUNTER — Telehealth: Payer: Self-pay

## 2021-06-06 NOTE — Telephone Encounter (Addendum)
Phone call to patient and informed of scheduled [facility] [place] appt for 07/12/21 @ 1:00. Instructed to arrive at 12:45 for check in. Also reminded of next scheduled MH RV appt for 06/27/2021 at 2:00 (1:45 check in.) Tawny Hopping, RN

## 2021-06-06 NOTE — Telephone Encounter (Signed)
See 06/06/21 previous phone note. Tawny Hopping, RN

## 2021-06-27 ENCOUNTER — Ambulatory Visit: Payer: Self-pay | Admitting: Advanced Practice Midwife

## 2021-06-27 ENCOUNTER — Other Ambulatory Visit: Payer: Self-pay

## 2021-06-27 VITALS — BP 103/68 | HR 80 | Temp 97.4°F | Wt 131.4 lb

## 2021-06-27 DIAGNOSIS — O2342 Unspecified infection of urinary tract in pregnancy, second trimester: Secondary | ICD-10-CM

## 2021-06-27 DIAGNOSIS — O0992 Supervision of high risk pregnancy, unspecified, second trimester: Secondary | ICD-10-CM

## 2021-06-27 DIAGNOSIS — Z641 Problems related to multiparity: Secondary | ICD-10-CM

## 2021-06-27 DIAGNOSIS — O2340 Unspecified infection of urinary tract in pregnancy, unspecified trimester: Secondary | ICD-10-CM

## 2021-06-27 DIAGNOSIS — O0991 Supervision of high risk pregnancy, unspecified, first trimester: Secondary | ICD-10-CM

## 2021-06-27 DIAGNOSIS — N10 Acute tubulo-interstitial nephritis: Secondary | ICD-10-CM

## 2021-06-27 NOTE — Progress Notes (Signed)
   PRENATAL VISIT NOTE  Subjective:  Sherri Vasquez is a 30 y.o. G7P6006 at [redacted]w[redacted]d being seen today for ongoing prenatal care.  She is currently monitored for the following issues for this high-risk pregnancy and has Low birth weight infant x2 at term (06/09/2015 5 lbs, 03/24/2011 5 lbs); Grand multipara G7P6; Supervision of high risk pregnancy in first trimester; Elevated blood pressure affecting pregnancy in first trimester, antepartum; Poor historian; Acute pyelonephritis 05/02/21 with hospitalization IV Rocephin; Nephrolithiasis 05/02/21; First trimester pregnancy; Abnormal Pap smear of cervix 05/01/21; Susceptible to varicella (non-immune), currently pregnant; UTI (urinary tract infection) during pregnancy 05/01/21 >100,000 E. Coli; and History of pyelonephritis 2019 with hospitalization in [place] x 5 days on their problem list.  Patient reports no complaints.  Contractions: Not present. Vag. Bleeding: None.  Movement: Present. Denies leaking of fluid/ROM.   The following portions of the patient's history were reviewed and updated as appropriate: allergies, current medications, past family history, past medical history, past social history, past surgical history and problem list. Problem list updated.  Objective:   Vitals:   06/27/21 1342  BP: 103/68  Pulse: 80  Temp: (!) 97.4 F (36.3 C)  Weight: 131 lb 6.4 oz (59.6 kg)    Fetal Status:   Fundal Height: 20 cm Movement: Present     General:  Alert, oriented and cooperative. Patient is in no acute distress.  Skin: Skin is warm and dry. No rash noted.   Cardiovascular: Normal heart rate noted  Respiratory: Normal respiratory effort, no problems with respiration noted  Abdomen: Soft, gravid, appropriate for gestational age.  Pain/Pressure: Absent     Pelvic: Cervical exam deferred        Extremities: Normal range of motion.  Edema: None  Mental Status: Normal mood and affect. Normal behavior. Normal judgment and thought content.    Assessment and Plan:  Pregnancy: G7P6006 at [redacted]w[redacted]d  1. Supervision of high risk pregnancy in first trimester Pt crying because FOB went back to his girlfriend who he has 2 children with. She has been with FOB x several months. Pt agrees to talk to Kathreen Cosier, LCSW--contact card given and referral made Living with her 6 kids; not employed Reminded of 07/12/21 u/s  2. Grand multipara G7P6   3. Low birth weight infant x2 at term (06/09/2015 5 lbs, 03/24/2011 5 lbs) 1 lb 6.4 oz (0.635 kg)'  4. Acute pyelonephritis 05/02/21 with hospitalization IV Rocephin Needs C&S q trimester 05/29/21 C&S neg   Preterm labor symptoms and general obstetric precautions including but not limited to vaginal bleeding, contractions, leaking of fluid and fetal movement were reviewed in detail with the patient. Please refer to After Visit Summary for other counseling recommendations.  No follow-ups on file.  Future Appointments  Date Time Provider Department Center  07/12/2021  1:00 PM [facility]  07/25/2021  1:20 PM [facility] PROVIDER [facility] None    Alberteen Spindle, CNM

## 2021-06-27 NOTE — Progress Notes (Signed)
Patient here for MH RV at 20 2/7. Patient aware of [facility] U/S on 07/12/2021 at 1:00. Counseled to arrive 12:45pm. Reminder card given.Burt Knack, RN

## 2021-07-11 ENCOUNTER — Other Ambulatory Visit: Payer: Self-pay | Admitting: Maternal & Fetal Medicine

## 2021-07-11 DIAGNOSIS — O0992 Supervision of high risk pregnancy, unspecified, second trimester: Secondary | ICD-10-CM

## 2021-07-12 ENCOUNTER — Ambulatory Visit: Payer: Self-pay | Attending: Maternal & Fetal Medicine

## 2021-07-12 ENCOUNTER — Other Ambulatory Visit: Payer: Self-pay

## 2021-07-12 VITALS — BP 115/72 | HR 102 | Temp 98.2°F | Resp 20 | Ht 61.0 in | Wt 136.0 lb

## 2021-07-12 DIAGNOSIS — Z3689 Encounter for other specified antenatal screening: Secondary | ICD-10-CM

## 2021-07-12 DIAGNOSIS — O0942 Supervision of pregnancy with grand multiparity, second trimester: Secondary | ICD-10-CM | POA: Insufficient documentation

## 2021-07-12 DIAGNOSIS — Z3A22 22 weeks gestation of pregnancy: Secondary | ICD-10-CM

## 2021-07-12 DIAGNOSIS — Z2839 Other underimmunization status: Secondary | ICD-10-CM

## 2021-07-12 DIAGNOSIS — O0992 Supervision of high risk pregnancy, unspecified, second trimester: Secondary | ICD-10-CM | POA: Insufficient documentation

## 2021-07-12 DIAGNOSIS — Z3A21 21 weeks gestation of pregnancy: Secondary | ICD-10-CM | POA: Insufficient documentation

## 2021-07-12 DIAGNOSIS — Z8759 Personal history of other complications of pregnancy, childbirth and the puerperium: Secondary | ICD-10-CM

## 2021-07-12 DIAGNOSIS — O09899 Supervision of other high risk pregnancies, unspecified trimester: Secondary | ICD-10-CM

## 2021-07-16 ENCOUNTER — Encounter: Payer: Self-pay | Admitting: Family Medicine

## 2021-07-16 NOTE — Progress Notes (Signed)
Updated [place] and EDD

## 2021-07-18 ENCOUNTER — Encounter: Payer: Self-pay | Admitting: Advanced Practice Midwife

## 2021-07-25 ENCOUNTER — Other Ambulatory Visit: Payer: Self-pay

## 2021-07-25 ENCOUNTER — Ambulatory Visit: Payer: Self-pay | Admitting: Advanced Practice Midwife

## 2021-07-25 VITALS — BP 111/69 | HR 93 | Temp 99.2°F | Wt 137.8 lb

## 2021-07-25 DIAGNOSIS — Z641 Problems related to multiparity: Secondary | ICD-10-CM

## 2021-07-25 DIAGNOSIS — R87618 Other abnormal cytological findings on specimens from cervix uteri: Secondary | ICD-10-CM

## 2021-07-25 DIAGNOSIS — O0992 Supervision of high risk pregnancy, unspecified, second trimester: Secondary | ICD-10-CM

## 2021-07-25 DIAGNOSIS — O0991 Supervision of high risk pregnancy, unspecified, first trimester: Secondary | ICD-10-CM

## 2021-07-25 DIAGNOSIS — N10 Acute tubulo-interstitial nephritis: Secondary | ICD-10-CM

## 2021-07-25 MED ORDER — PRENATAL MULTIVITAMIN CH: 1.0000 | ORAL_TABLET | Freq: Every day | ORAL | 0 refills | Status: DC

## 2021-07-25 NOTE — Progress Notes (Signed)
   PRENATAL VISIT NOTE  Subjective:  Sherri Vasquez is a 30 y.o. G7P6006 at [redacted]w[redacted]d being seen today for ongoing prenatal care.  She is currently monitored for the following issues for this high-risk pregnancy and has Low birth weight infant x2 at term (06/09/2015 5 lbs, 03/24/2011 5 lbs); Grand multipara G7P6; Supervision of high risk pregnancy in first trimester; Elevated blood pressure affecting pregnancy in first trimester, antepartum; Poor historian; Acute pyelonephritis 05/02/21 with hospitalization IV Rocephin; Nephrolithiasis 05/02/21; First trimester pregnancy; Abnormal Pap smear of cervix 05/01/21; Susceptible to varicella (non-immune), currently pregnant; UTI (urinary tract infection) during pregnancy 05/01/21 >100,000 E. Coli; and History of pyelonephritis 2019 with hospitalization in [place] x 5 days on their problem list.  Patient reports no complaints.  Contractions: Not present. Vag. Bleeding: None.  Movement: Present. Denies leaking of fluid/ROM.   The following portions of the patient's history were reviewed and updated as appropriate: allergies, current medications, past family history, past medical history, past social history, past surgical history and problem list. Problem list updated.  Objective:   Vitals:   07/25/21 1313  BP: 111/69  Pulse: 93  Temp: 99.2 F (37.3 C)  Weight: 137 lb 12.8 oz (62.5 kg)    Fetal Status: Fetal Heart Rate (bpm): 150 Fundal Height: 23 cm Movement: Present     General:  Alert, oriented and cooperative. Patient is in no acute distress.  Skin: Skin is warm and dry. No rash noted.   Cardiovascular: Normal heart rate noted  Respiratory: Normal respiratory effort, no problems with respiration noted  Abdomen: Soft, gravid, appropriate for gestational age.  Pain/Pressure: Absent     Pelvic: Cervical exam deferred        Extremities: Normal range of motion.  Edema: None  Mental Status: Normal mood and affect. Normal behavior. Normal judgment and  thought content.   Assessment and Plan:  Pregnancy: G7P6006 at [redacted]w[redacted]d  1. Supervision of high risk pregnancy in first trimester Pt states she did not make an apt with Kathreen Cosier; she spoke to FOB and he said he will financially support the baby even though he is now living with his girlfriend who he has 2 children with; PHQ-9=6 - Prenatal Vit-Fe Fumarate-FA (PRENATAL MULTIVITAMIN) TABS tablet; Take 1 tablet by mouth daily at 12 noon.  Dispense: 100 tablet; Refill: 0 - IGP, Aptima HPV  2. Other abnormal cytological finding of specimen from cervix 05/01/21 neg HPV +. Needed cotest 04/2022--done today. Long discussion with pt as she is upset because her mom just had a hysterectomy for uterine cancer in [place].   3. Grand multipara G7P6   4. Acute pyelonephritis 05/02/21 with hospitalization IV Rocephin Drinking 2 c. Coffee/day + occassional tea. Counseled not to drink caffeine. Will need C&S next apt due to hx pyelo   Preterm labor symptoms and general obstetric precautions including but not limited to vaginal bleeding, contractions, leaking of fluid and fetal movement were reviewed in detail with the patient. Please refer to After Visit Summary for other counseling recommendations.  Return in about 4 weeks (around 08/22/2021) for 28 week labs, routine PNC.  No future appointments.  Alberteen Spindle, CNM

## 2021-07-28 LAB — IGP, APTIMA HPV
HPV Aptima: POSITIVE — AB
PAP Smear Comment: 0

## 2021-08-13 IMAGING — US US OB COMP LESS 14 WK
2 series · 14 of 28 positions shown · non-contrast
Comparison: None.

CLINICAL DATA: Pelvic pain

EXAM:
OBSTETRIC <14 WK ULTRASOUND
TECHNIQUE: Transabdominal ultrasound was performed for evaluation of the
gestation as well as the maternal uterus and adnexal regions.

[Series 1: us ob less than 14 weeks with ob transvaginal · 13 of 67 slices shown]
[im 3/67]
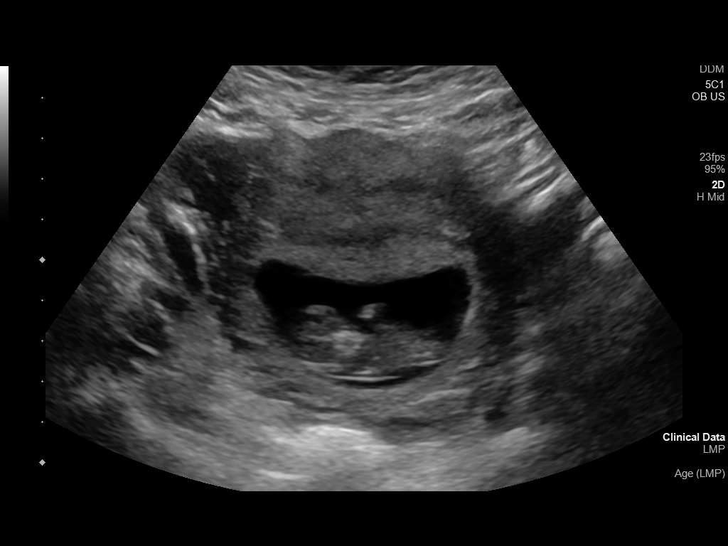
[im 8/67]
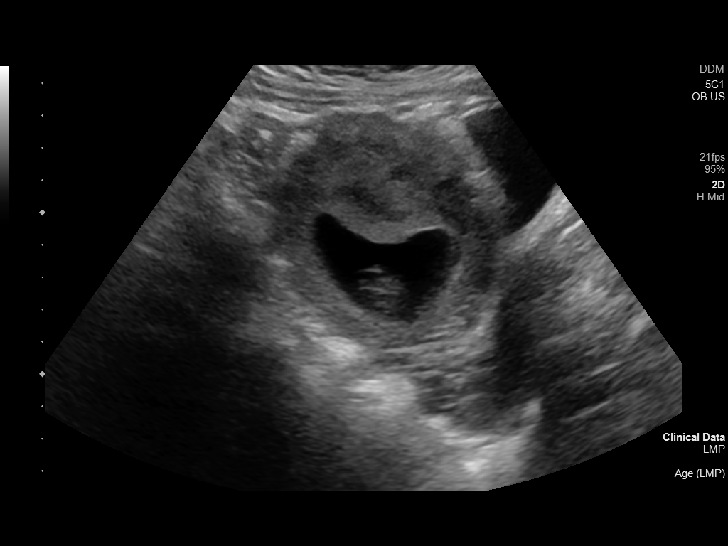
[im 13/67]
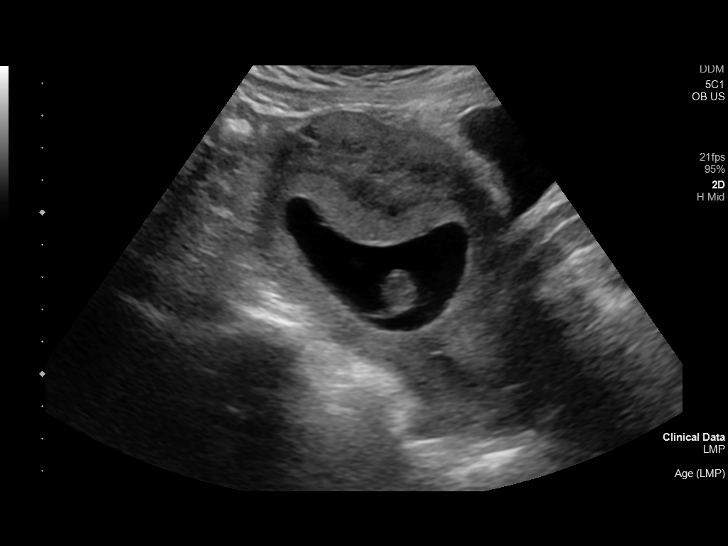
[im 18/67]
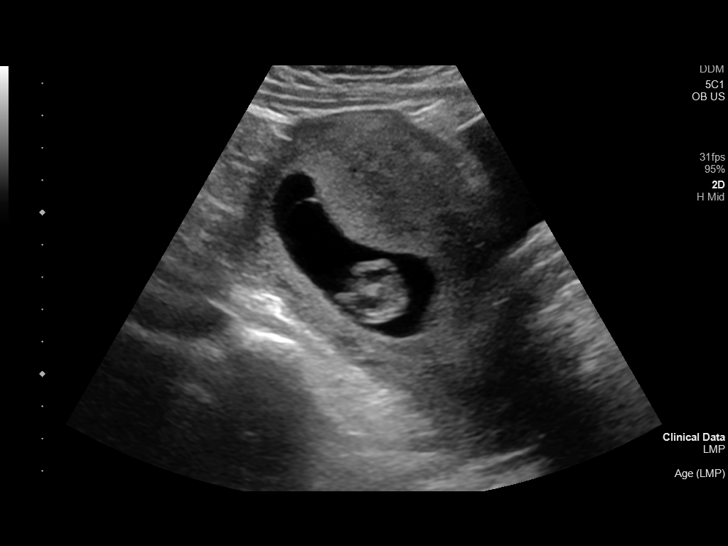
[im 23/67]
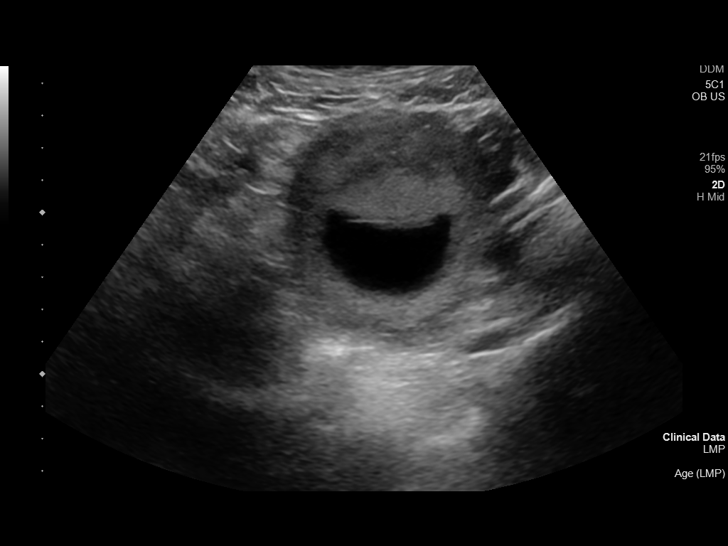
[im 28/67]
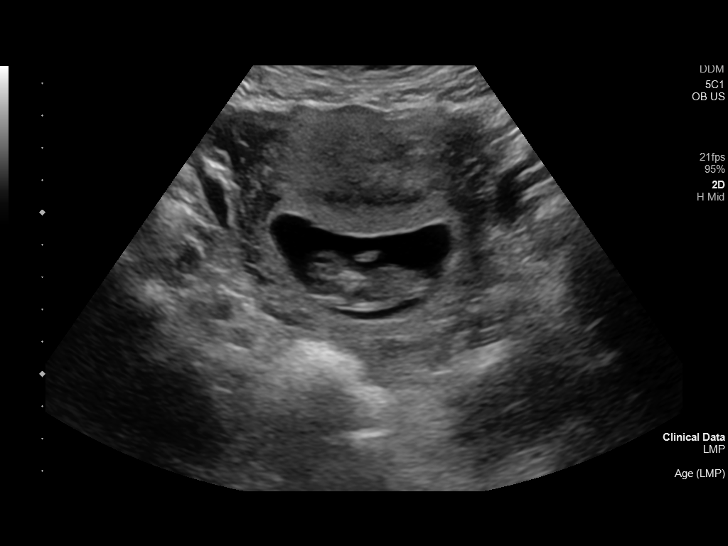
[im 34/67]
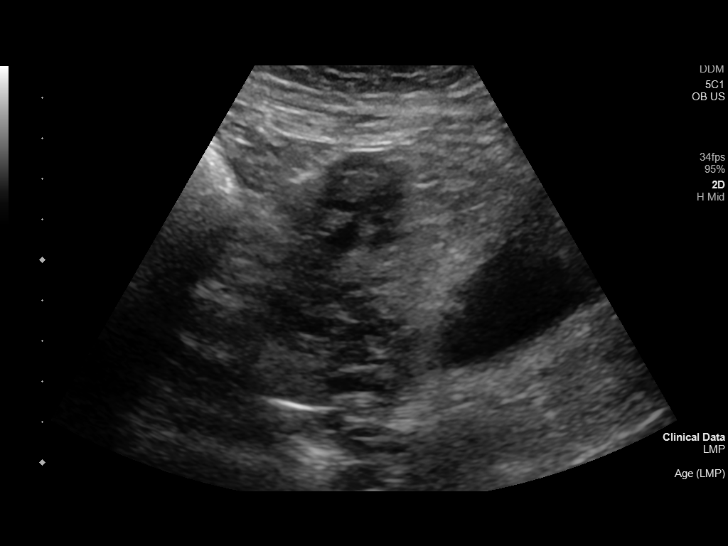
[im 39/67]
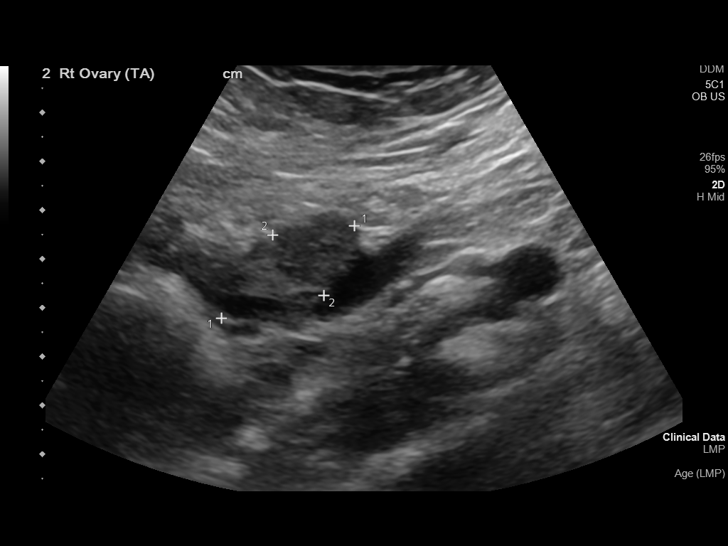
[im 44/67]
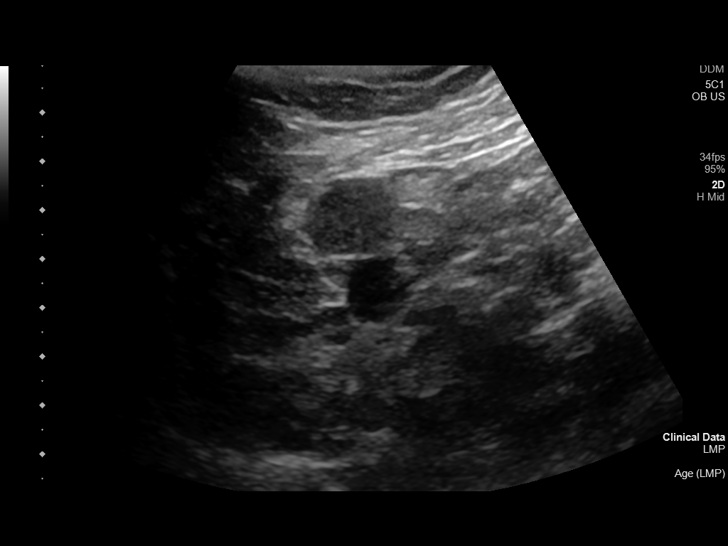
[im 49/67]
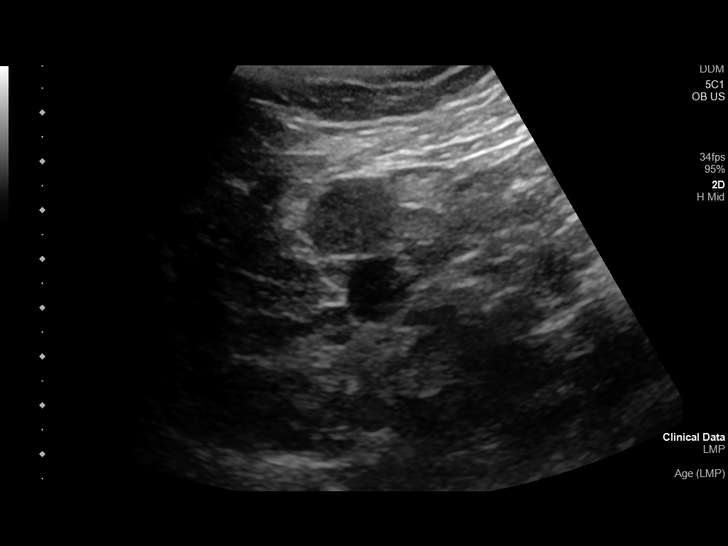
[im 54/67]
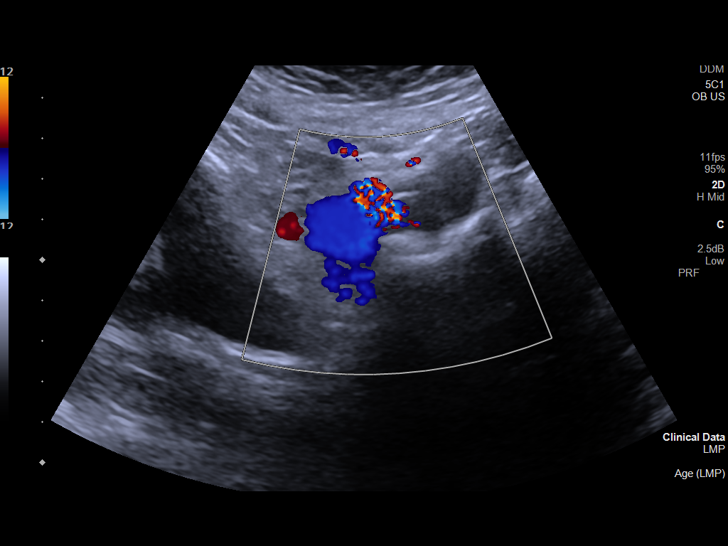
[im 59/67]
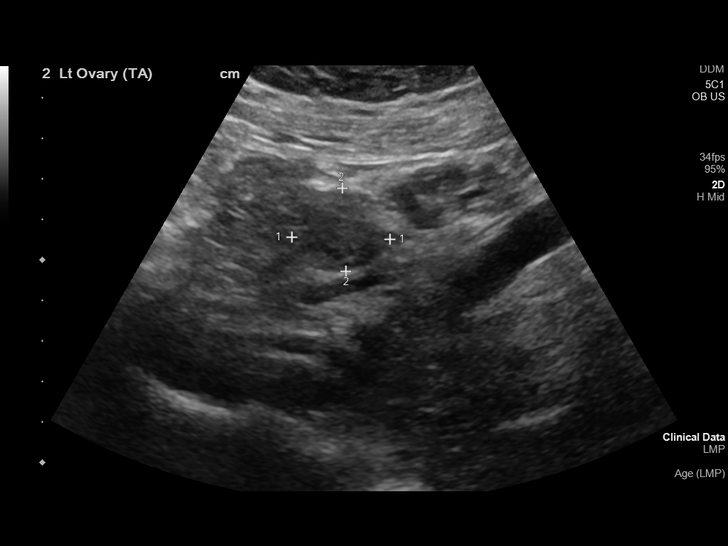
[im 64/67]
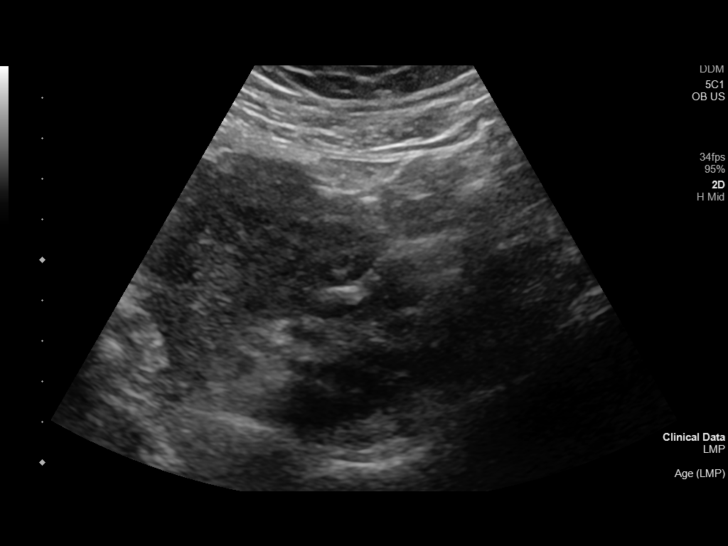

[Series 1001: early ob us · arterial · 1 of 1 slices shown]
[im 1/1]
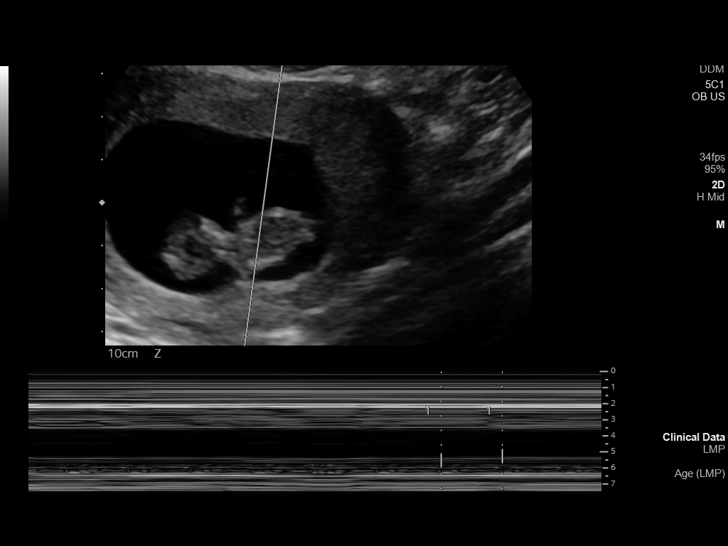

[14 of 28 positions shown; findings below may reference images not displayed]

FINDINGS: Intrauterine gestational sac: Visualized-single

Yolk sac:  Not visualized

Embryo:  Visualized

Cardiac Activity: Visualized

Heart Rate: 167 bpm

CRL:   41 mm   11 w 0 d                  US EDC: November 21, 2021

Subchorionic hemorrhage:  None visualized.

Maternal uterus/adnexae: Cervical os closed. Right ovary measures
3.3 x 1.6 x 1.8 cm. Left ovary measures 2.4 x 2.1 x 1.7 cm. No
extrauterine pelvic mass or free fluid.
IMPRESSION: Single live intrauterine gestation with estimated gestational age of
11 weeks. No evidence of chorionic hemorrhage. No extrauterine
pelvic mass or fluid.

## 2021-08-22 ENCOUNTER — Encounter: Payer: Self-pay | Admitting: Advanced Practice Midwife

## 2021-08-22 ENCOUNTER — Ambulatory Visit: Payer: Self-pay | Admitting: Advanced Practice Midwife

## 2021-08-22 ENCOUNTER — Other Ambulatory Visit: Payer: Self-pay

## 2021-08-22 VITALS — BP 106/64 | HR 90 | Temp 98.1°F | Wt 142.0 lb

## 2021-08-22 DIAGNOSIS — O99013 Anemia complicating pregnancy, third trimester: Secondary | ICD-10-CM

## 2021-08-22 DIAGNOSIS — O0993 Supervision of high risk pregnancy, unspecified, third trimester: Secondary | ICD-10-CM

## 2021-08-22 DIAGNOSIS — O99012 Anemia complicating pregnancy, second trimester: Secondary | ICD-10-CM

## 2021-08-22 DIAGNOSIS — N10 Acute tubulo-interstitial nephritis: Secondary | ICD-10-CM

## 2021-08-22 DIAGNOSIS — Z87448 Personal history of other diseases of urinary system: Secondary | ICD-10-CM

## 2021-08-22 DIAGNOSIS — R87618 Other abnormal cytological findings on specimens from cervix uteri: Secondary | ICD-10-CM

## 2021-08-22 DIAGNOSIS — Z641 Problems related to multiparity: Secondary | ICD-10-CM

## 2021-08-22 DIAGNOSIS — O0991 Supervision of high risk pregnancy, unspecified, first trimester: Secondary | ICD-10-CM

## 2021-08-22 HISTORY — DX: Anemia complicating pregnancy, second trimester: O99.012

## 2021-08-22 LAB — HEMOGLOBIN, FINGERSTICK: Hemoglobin: 10.4 g/dL — ABNORMAL LOW (ref 11.1–15.9)

## 2021-08-22 MED ORDER — IRON (FERROUS SULFATE) 325 (65 FE) MG PO TABS: 1.0000 | ORAL_TABLET | Freq: Every day | ORAL | 0 refills | Status: AC

## 2021-08-22 NOTE — Progress Notes (Signed)
Here today for 27.0 week MH RV. Taking PNV QD, denies ED/hospital visits since last RV. Undecided regarding PP BCM and Peds choice, has info. 28 week labs and Tdap today. Tawny Hopping, RN

## 2021-08-22 NOTE — Progress Notes (Signed)
   PRENATAL VISIT NOTE  Subjective:  Sherri Vasquez is a 30 y.o. G7P6006 at [redacted]w[redacted]d being seen today for ongoing prenatal care.  She is currently monitored for the following issues for this high-risk pregnancy and has Low birth weight infant x2 at term (06/09/2015 5 lbs, 03/24/2011 5 lbs); Grand multipara G7P6; Supervision of high risk pregnancy in first trimester; Elevated blood pressure affecting pregnancy in first trimester, antepartum; Poor historian; Acute pyelonephritis 05/02/21 with hospitalization IV Rocephin; Nephrolithiasis 05/02/21; First trimester pregnancy; Abnormal Pap smear of cervix 05/01/21; Susceptible to varicella (non-immune), currently pregnant; UTI (urinary tract infection) during pregnancy 05/01/21 >100,000 E. Coli; and History of pyelonephritis 2019 with hospitalization in [place] x 5 days on their problem list.  Patient reports  insomnia .  Contractions: Not present. Vag. Bleeding: None.  Movement: Present. Denies leaking of fluid/ROM.   The following portions of the patient's history were reviewed and updated as appropriate: allergies, current medications, past family history, past medical history, past social history, past surgical history and problem list. Problem list updated.  Objective:   Vitals:   08/22/21 1318  BP: 106/64  Pulse: 90  Temp: 98.1 F (36.7 C)  Weight: 142 lb (64.4 kg)    Fetal Status: Fetal Heart Rate (bpm): 140 Fundal Height: 27 cm Movement: Present     General:  Alert, oriented and cooperative. Patient is in no acute distress.  Skin: Skin is warm and dry. No rash noted.   Cardiovascular: Normal heart rate noted  Respiratory: Normal respiratory effort, no problems with respiration noted  Abdomen: Soft, gravid, appropriate for gestational age.  Pain/Pressure: Absent     Pelvic: Cervical exam deferred        Extremities: Normal range of motion.  Edema: None  Mental Status: Normal mood and affect. Normal behavior. Normal judgment and thought  content.   Assessment and Plan:  Pregnancy: G7P6006 at [redacted]w[redacted]d 1. Supervision of high risk pregnancy in first trimester C/o insomnia--handout and suggestions given Not working. Walks 5x/wk x 5-10 min--counseled to increase to 20 min Here with 3 yo son who has an apt next week at [facility] for a yearly physical Wants BTL--RN to give cost sheet to pt - Hemoglobin, fingerstick - Tdap vaccine greater than or equal to 7yo IM - Glucose, 1 hour gestational - HIV-1/HIV-2 Qualitative RNA - RPR  2. Acute pyelonephritis 05/02/21 with hospitalization IV Rocephin Last C&S 05/29/21 neg C&S today - Urine Culture & Sensitivity  3. Other abnormal cytological finding of specimen from cervix Needs Colpo pp for 07/25/21 unsatisfactory for evaluation with HPV+ 05/01/21 neg HPV +  4. History of pyelonephritis 2019 with hospitalization in [place] x 5 days Denies sxs UTI  5. Grand multipara G7P6    - Hemoglobin, fingerstick - Tdap vaccine greater than or equal to 7yo IM - Glucose, 1 hour gestational - HIV-1/HIV-2 Qualitative RNA - RPR - Urine Culture & Sensitivity   Preterm labor symptoms and general obstetric precautions including but not limited to vaginal bleeding, contractions, leaking of fluid and fetal movement were reviewed in detail with the patient. Please refer to After Visit Summary for other counseling recommendations.  Return in about 2 weeks (around 09/05/2021) for routine PNC.  No future appointments.  Alberteen Spindle, CNM

## 2021-08-22 NOTE — Progress Notes (Signed)
Hgb 10.4. Anemia Panel lab ordered. Iron initiated per standing order. Instructed to take one tablet QD at least two hours before or after taking PNV and to take with a Vitamin C source. Recheck Hgb in one month. Tawny Hopping, RN

## 2021-08-23 LAB — FE+CBC/D/PLT+TIBC+FER+RETIC
Basophils Absolute: 0 10*3/uL (ref 0.0–0.2)
Basos: 0 %
EOS (ABSOLUTE): 0.1 10*3/uL (ref 0.0–0.4)
Eos: 1 %
Ferritin: 8 ng/mL — ABNORMAL LOW (ref 15–150)
Hematocrit: 31.1 % — ABNORMAL LOW (ref 34.0–46.6)
Hemoglobin: 10.5 g/dL — ABNORMAL LOW (ref 11.1–15.9)
Immature Grans (Abs): 0.1 10*3/uL (ref 0.0–0.1)
Immature Granulocytes: 1 %
Iron Saturation: 7 % — CL (ref 15–55)
Iron: 36 ug/dL (ref 27–159)
Lymphocytes Absolute: 1.5 10*3/uL (ref 0.7–3.1)
Lymphs: 24 %
MCH: 29.9 pg (ref 26.6–33.0)
MCHC: 33.8 g/dL (ref 31.5–35.7)
MCV: 89 fL (ref 79–97)
Monocytes Absolute: 0.5 10*3/uL (ref 0.1–0.9)
Monocytes: 8 %
Neutrophils Absolute: 4.2 10*3/uL (ref 1.4–7.0)
Neutrophils: 66 %
Platelets: 264 10*3/uL (ref 150–450)
RBC: 3.51 x10E6/uL — ABNORMAL LOW (ref 3.77–5.28)
RDW: 12.9 % (ref 11.7–15.4)
Retic Ct Pct: 2.2 % (ref 0.6–2.6)
Total Iron Binding Capacity: 545 ug/dL — ABNORMAL HIGH (ref 250–450)
UIBC: 509 ug/dL — ABNORMAL HIGH (ref 131–425)
WBC: 6.4 10*3/uL (ref 3.4–10.8)

## 2021-08-23 LAB — RPR: RPR Ser Ql: NONREACTIVE

## 2021-08-23 LAB — GLUCOSE, 1 HOUR GESTATIONAL: Gestational Diabetes Screen: 97 mg/dL (ref 65–139)

## 2021-08-24 LAB — HIV-1/HIV-2 QUALITATIVE RNA
HIV-1 RNA, Qualitative: NONREACTIVE
HIV-2 RNA, Qualitative: NONREACTIVE

## 2021-08-25 LAB — URINE CULTURE

## 2021-09-03 NOTE — Addendum Note (Signed)
Addended by: Heywood Bene on: 09/03/2021 02:11 PM   Modules accepted: Orders

## 2021-09-05 ENCOUNTER — Ambulatory Visit: Payer: Self-pay

## 2021-09-06 ENCOUNTER — Encounter: Payer: Self-pay | Admitting: Physician Assistant

## 2021-09-06 ENCOUNTER — Ambulatory Visit: Payer: Self-pay | Admitting: Physician Assistant

## 2021-09-06 ENCOUNTER — Other Ambulatory Visit: Payer: Self-pay

## 2021-09-06 VITALS — BP 117/75 | HR 107 | Temp 97.5°F | Wt 145.6 lb

## 2021-09-06 DIAGNOSIS — O2343 Unspecified infection of urinary tract in pregnancy, third trimester: Secondary | ICD-10-CM

## 2021-09-06 DIAGNOSIS — O99012 Anemia complicating pregnancy, second trimester: Secondary | ICD-10-CM

## 2021-09-06 DIAGNOSIS — O2340 Unspecified infection of urinary tract in pregnancy, unspecified trimester: Secondary | ICD-10-CM

## 2021-09-06 DIAGNOSIS — O99013 Anemia complicating pregnancy, third trimester: Secondary | ICD-10-CM

## 2021-09-06 DIAGNOSIS — O0993 Supervision of high risk pregnancy, unspecified, third trimester: Secondary | ICD-10-CM

## 2021-09-06 DIAGNOSIS — O0991 Supervision of high risk pregnancy, unspecified, first trimester: Secondary | ICD-10-CM

## 2021-09-06 NOTE — Progress Notes (Signed)
Patient here for MH RV at 29 1/7. Desires BTL, given phone numbers to call to try to estimate out of pocket costs.Burt Knack, RN

## 2021-09-06 NOTE — Progress Notes (Signed)
[facility]  PRENATAL VISIT NOTE  Subjective:  Sherri Vasquez is a 30 y.o. G7P6006 at [redacted]w[redacted]d being seen today for ongoing prenatal care.  She is currently monitored for the following issues for this high-risk pregnancy and has Low birth weight infant x2 at term (06/09/2015 5 lbs, 03/24/2011 5 lbs); Grand multipara G7P6; Supervision of high risk pregnancy in first trimester; Elevated blood pressure affecting pregnancy in first trimester, antepartum; Poor historian; Acute pyelonephritis 05/02/21 with hospitalization IV Rocephin; Nephrolithiasis 05/02/21; First trimester pregnancy; Abnormal Pap smear of cervix 05/01/21; Susceptible to varicella (non-immune), currently pregnant; UTI (urinary tract infection) during pregnancy 05/01/21 >100,000 E. Coli; History of pyelonephritis 2019 with hospitalization in [place] x 5 days; and Anemia affecting pregnancy in second trimester on their problem list.  Patient reports no complaints.  Contractions: Not present. Vag. Bleeding: None.  Movement: Present. Denies leaking of fluid/ROM.   The following portions of the patient's history were reviewed and updated as appropriate: allergies, current medications, past family history, past medical history, past social history, past surgical history and problem list. Problem list updated.  Objective:   Vitals:   09/06/21 1338  BP: 117/75  Pulse: (!) 107  Temp: (!) 97.5 F (36.4 C)  Weight: 145 lb 9.6 oz (66 kg)    Fetal Status: Fetal Heart Rate (bpm): 144 Fundal Height: 30 cm Movement: Present     General:  Alert, oriented and cooperative. Patient is in no acute distress.  Skin: Skin is warm and dry. No rash noted.   Cardiovascular: Normal heart rate noted  Respiratory: Normal respiratory effort, no problems with respiration noted  Abdomen: Soft, gravid, appropriate for gestational age.  Pain/Pressure: Absent     Pelvic: Cervical exam deferred        Extremities: Normal  range of motion.  Edema: None  Mental Status: Normal mood and affect. Normal behavior. Normal judgment and thought content.   Assessment and Plan:  Pregnancy: G7P6006 at [redacted]w[redacted]d  1. Supervision of high risk pregnancy in first trimester Reviewed normal O'Sullivan result from prior visit. Doing well. Planning pp BTL. Continue PNV.  2. Anemia affecting pregnancy in second trimester Continue oral iron supplement.  3. Urinary tract infection in mother during pregnancy, antepartum Plan to recheck U C&S later in third trim.   Preterm labor symptoms and general obstetric precautions including but not limited to vaginal bleeding, contractions, leaking of fluid and fetal movement were reviewed in detail with the patient. Please refer to After Visit Summary for other counseling recommendations.  Return in about 2 weeks (around 09/20/2021) for Routine prenatal care.  No future appointments.  Landry Dyke, PA-C

## 2021-09-20 ENCOUNTER — Encounter: Payer: Self-pay | Admitting: Physician Assistant

## 2021-09-20 ENCOUNTER — Other Ambulatory Visit: Payer: Self-pay

## 2021-09-20 ENCOUNTER — Ambulatory Visit: Payer: Self-pay | Admitting: Physician Assistant

## 2021-09-20 VITALS — BP 112/68 | HR 83 | Temp 98.4°F | Wt 145.2 lb

## 2021-09-20 DIAGNOSIS — O2340 Unspecified infection of urinary tract in pregnancy, unspecified trimester: Secondary | ICD-10-CM

## 2021-09-20 DIAGNOSIS — O0993 Supervision of high risk pregnancy, unspecified, third trimester: Secondary | ICD-10-CM

## 2021-09-20 DIAGNOSIS — O99012 Anemia complicating pregnancy, second trimester: Secondary | ICD-10-CM

## 2021-09-20 DIAGNOSIS — N10 Acute tubulo-interstitial nephritis: Secondary | ICD-10-CM

## 2021-09-20 DIAGNOSIS — O0991 Supervision of high risk pregnancy, unspecified, first trimester: Secondary | ICD-10-CM

## 2021-09-20 DIAGNOSIS — Z23 Encounter for immunization: Secondary | ICD-10-CM

## 2021-09-20 DIAGNOSIS — O2343 Unspecified infection of urinary tract in pregnancy, third trimester: Secondary | ICD-10-CM

## 2021-09-20 LAB — HEMOGLOBIN, FINGERSTICK: Hemoglobin: 10.6 g/dL — ABNORMAL LOW (ref 11.1–15.9)

## 2021-09-20 NOTE — Progress Notes (Addendum)
Hgb 10.6 today, recheck in 4 weeks. Flu vaccine given, left deltoid, tolerated well, VIS given. NCIR in TEFL teacher.Burt Knack, RN

## 2021-09-20 NOTE — Progress Notes (Signed)
[facility]  PRENATAL VISIT NOTE  Subjective:  Sherri Vasquez is a 30 y.o. G7P6006 at [redacted]w[redacted]d being seen today for ongoing prenatal care.  She is currently monitored for the following issues for this high-risk pregnancy and has Low birth weight infant x2 at term (06/09/2015 5 lbs, 03/24/2011 5 lbs); Grand multipara G7P6; Supervision of high risk pregnancy in first trimester; Elevated blood pressure affecting pregnancy in first trimester, antepartum; Poor historian; Acute pyelonephritis 05/02/21 with hospitalization IV Rocephin; Nephrolithiasis 05/02/21; Abnormal Pap smear of cervix 05/01/21; Susceptible to varicella (non-immune), currently pregnant; UTI (urinary tract infection) during pregnancy 05/01/21 >100,000 E. Coli; History of pyelonephritis 2019 with hospitalization in [place] x 5 days; and Anemia affecting pregnancy in second trimester on their problem list.  Patient reports no complaints.  Contractions: Not present. Vag. Bleeding: None.  Movement: Present. Denies leaking of fluid/ROM.   The following portions of the patient's history were reviewed and updated as appropriate: allergies, current medications, past family history, past medical history, past social history, past surgical history and problem list. Problem list updated.  Objective:   Vitals:   09/20/21 1335  BP: 112/68  Pulse: 83  Temp: 98.4 F (36.9 C)  Weight: 145 lb 3.2 oz (65.9 kg)    Fetal Status: Fetal Heart Rate (bpm): 156 Fundal Height: 31 cm Movement: Present     General:  Alert, oriented and cooperative. Patient is in no acute distress.  Skin: Skin is warm and dry. No rash noted.   Cardiovascular: Normal heart rate noted  Respiratory: Normal respiratory effort, no problems with respiration noted  Abdomen: Soft, gravid, appropriate for gestational age.  Pain/Pressure: Absent     Pelvic: Cervical exam deferred        Extremities: Normal range of motion.  Edema: None   Mental Status: Normal mood and affect. Normal behavior. Normal judgment and thought content.   Assessment and Plan:  Pregnancy: G7P6006 at [redacted]w[redacted]d  1. Supervision of high risk pregnancy in first trimester Recommend annual flu vaccine - pt desires today. - Hemoglobin, fingerstick  2. Anemia affecting pregnancy in second trimester Taking oral iron. Check hemoglobin today. - Hemoglobin, fingerstick  3. Acute pyelonephritis 05/02/21 with hospitalization IV Rocephin No symptoms. TOC neg less than 1 mo ago. Plan to recheck urine culture later in 3rd trim.  4. Urinary tract infection in mother during pregnancy, antepartum Feels well, U C&S 25-50 MUF 08/22/21, plan repeat later in 3rd trim.  Preterm labor symptoms and general obstetric precautions including but not limited to vaginal bleeding, contractions, leaking of fluid and fetal movement were reviewed in detail with the patient. Please refer to After Visit Summary for other counseling recommendations.  Return in about 2 weeks (around 10/04/2021) for Routine prenatal care.  Future Appointments  Date Time Provider Department Center  10/04/2021  1:40 PM [facility] PROVIDER [facility] None    Landry Dyke, PA-C

## 2021-09-20 NOTE — Progress Notes (Signed)
Patient here for MH RV at 31 1/7. Kick counts reviewed and cards given. Patient states she has been taking iron tablets with water, counseled to take with vitamin C juice as vitamin C helps body to absorb iron.Marland KitchenMarland KitchenBurt Knack, RN

## 2021-10-04 ENCOUNTER — Other Ambulatory Visit: Payer: Self-pay

## 2021-10-04 ENCOUNTER — Ambulatory Visit: Payer: Self-pay | Admitting: Advanced Practice Midwife

## 2021-10-04 VITALS — BP 102/62 | HR 77 | Temp 98.0°F | Wt 144.2 lb

## 2021-10-04 DIAGNOSIS — O0991 Supervision of high risk pregnancy, unspecified, first trimester: Secondary | ICD-10-CM

## 2021-10-04 DIAGNOSIS — Z641 Problems related to multiparity: Secondary | ICD-10-CM

## 2021-10-04 DIAGNOSIS — O0993 Supervision of high risk pregnancy, unspecified, third trimester: Secondary | ICD-10-CM

## 2021-10-04 DIAGNOSIS — Z87448 Personal history of other diseases of urinary system: Secondary | ICD-10-CM

## 2021-10-04 DIAGNOSIS — O99012 Anemia complicating pregnancy, second trimester: Secondary | ICD-10-CM

## 2021-10-04 DIAGNOSIS — O99013 Anemia complicating pregnancy, third trimester: Secondary | ICD-10-CM

## 2021-10-04 LAB — URINALYSIS
Bilirubin, UA: NEGATIVE
Glucose, UA: NEGATIVE
Ketones, UA: NEGATIVE
Leukocytes,UA: NEGATIVE
Nitrite, UA: NEGATIVE
Protein,UA: NEGATIVE
RBC, UA: NEGATIVE
Specific Gravity, UA: 1.015 (ref 1.005–1.030)
Urobilinogen, Ur: 0.2 mg/dL (ref 0.2–1.0)
pH, UA: 7 (ref 5.0–7.5)

## 2021-10-04 NOTE — Progress Notes (Signed)
[facility]  PRENATAL VISIT NOTE  Subjective:  Sherri Vasquez is a 30 y.o. G7P6006 at [redacted]w[redacted]d being seen today for ongoing prenatal care.  She is currently monitored for the following issues for this high-risk pregnancy and has Low birth weight infant x2 at term (06/09/2015 5 lbs, 03/24/2011 5 lbs); Grand multipara G7P6; Supervision of high risk pregnancy in first trimester; Elevated blood pressure affecting pregnancy in first trimester, antepartum; Poor historian; Acute pyelonephritis 05/02/21 with hospitalization IV Rocephin; Nephrolithiasis 05/02/21; Abnormal Pap smear of cervix 05/01/21; Susceptible to varicella (non-immune), currently pregnant; UTI (urinary tract infection) during pregnancy 05/01/21 >100,000 E. Coli; History of pyelonephritis 2019 with hospitalization in [place] x 5 days; and Anemia affecting pregnancy in second trimester on their problem list.  Patient reports no complaints.   .  .  Movement: Present. Denies leaking of fluid/ROM.   The following portions of the patient's history were reviewed and updated as appropriate: allergies, current medications, past family history, past medical history, past social history, past surgical history and problem list. Problem list updated.  Objective:   Vitals:   10/04/21 1344  BP: 102/62  Pulse: 77  Temp: 98 F (36.7 C)  Weight: 144 lb 3.2 oz (65.4 kg)    Fetal Status:   Fundal Height: 33 cm Movement: Present     General:  Alert, oriented and cooperative. Patient is in no acute distress.  Skin: Skin is warm and dry. No rash noted.   Cardiovascular: Normal heart rate noted  Respiratory: Normal respiratory effort, no problems with respiration noted  Abdomen: Soft, gravid, appropriate for gestational age.        Pelvic: Cervical exam deferred        Extremities: Normal range of motion.  Edema: None  Mental Status: Normal mood and affect. Normal behavior. Normal judgment and thought content.    Assessment and Plan:  Pregnancy: G7P6006 at [redacted]w[redacted]d  1. Supervision of high risk pregnancy in first trimester Worried because her nephew had a fever of 102.0 and symptoms on 10/01/21 and she took him to ER and +Covid. He was sleeping in her bed with her and she is worried.  Tested neg for covid here in office but told to test again on 10/08/21. She is asymptomatic Not working. Walking 3x/wk x 10 min. No car seat yet. - Urine Culture & Sensitivity - Urinalysis (Urine Dip)  2. Grand multipara G7P6 Alert L&D  3. Anemia affecting pregnancy in second trimester Taking I FeSo4 daily with oj seperately from vits  4. Low birth weight infant x2 at term (06/09/2015 5 lbs, 03/24/2011 5 lbs) S=D today 14 lb 3.2 oz (6.441 kg)   5. History of pyelonephritis 2019 with hospitalization in [place] x 5 days Last C&S 08/22/21 neg; to repeat today. Denies sxs UTI - Urine Culture & Sensitivity   Preterm labor symptoms and general obstetric precautions including but not limited to vaginal bleeding, contractions, leaking of fluid and fetal movement were reviewed in detail with the patient. Please refer to After Visit Summary for other counseling recommendations.  No follow-ups on file.  No future appointments.  Alberteen Spindle, CNM

## 2021-10-06 LAB — URINE CULTURE

## 2021-10-10 ENCOUNTER — Telehealth: Payer: Self-pay

## 2021-10-10 NOTE — Telephone Encounter (Signed)
Telephone call to patient today regarding her PAP results.  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  Colpo referral needed at pp (11/2021) per Arnetha Courser, NP.  History of Normal PAP + HPV on 05/01/2021.  PAP routed by Drema Pry 10/06/2021. Hart Carwin, RN

## 2021-10-18 ENCOUNTER — Other Ambulatory Visit: Payer: Self-pay

## 2021-10-18 ENCOUNTER — Encounter: Payer: Self-pay | Admitting: Physician Assistant

## 2021-10-18 ENCOUNTER — Ambulatory Visit: Payer: Self-pay | Admitting: Physician Assistant

## 2021-10-18 VITALS — BP 124/80 | HR 109 | Temp 97.3°F | Wt 146.2 lb

## 2021-10-18 DIAGNOSIS — O99013 Anemia complicating pregnancy, third trimester: Secondary | ICD-10-CM

## 2021-10-18 DIAGNOSIS — O0991 Supervision of high risk pregnancy, unspecified, first trimester: Secondary | ICD-10-CM

## 2021-10-18 DIAGNOSIS — O99012 Anemia complicating pregnancy, second trimester: Secondary | ICD-10-CM

## 2021-10-18 DIAGNOSIS — O0993 Supervision of high risk pregnancy, unspecified, third trimester: Secondary | ICD-10-CM

## 2021-10-18 DIAGNOSIS — N10 Acute tubulo-interstitial nephritis: Secondary | ICD-10-CM

## 2021-10-18 LAB — HEMOGLOBIN, FINGERSTICK: Hemoglobin: 11.1 g/dL (ref 11.1–15.9)

## 2021-10-18 NOTE — Progress Notes (Signed)
[facility]  PRENATAL VISIT NOTE  Subjective:  Sherri Vasquez is a 30 y.o. G7P6006 at [redacted]w[redacted]d being seen today for ongoing prenatal care.  She is currently monitored for the following issues for this high-risk pregnancy and has Low birth weight infant x2 at term (06/09/2015 5 lbs, 03/24/2011 5 lbs); Grand multipara G7P6; Supervision of high risk pregnancy in first trimester; Elevated blood pressure affecting pregnancy in first trimester, antepartum; Poor historian; Acute pyelonephritis 05/02/21 with hospitalization IV Rocephin; Nephrolithiasis 05/02/21; Abnormal Pap smear of cervix 05/01/21; Susceptible to varicella (non-immune), currently pregnant; UTI (urinary tract infection) during pregnancy 05/01/21 >100,000 E. Coli; History of pyelonephritis 2019 with hospitalization in [place] x 5 days; and Anemia affecting pregnancy in second trimester on their problem list.  Patient reports fatigue.  Contractions: Not present. Vag. Bleeding: None.  Movement: Present. Also has occ mild headache, neck and upper back pain and trouble sleeping. Drinks some caffeinated beverages, last 1-2pm daily. Denies leaking of fluid/ROM.   The following portions of the patient's history were reviewed and updated as appropriate: allergies, current medications, past family history, past medical history, past social history, past surgical history and problem list. Problem list updated.  Objective:   Vitals:   10/18/21 1315  BP: 124/80  Pulse: (!) 109  Temp: (!) 97.3 F (36.3 C)  Weight: 146 lb 3.2 oz (66.3 kg)    Fetal Status: Fetal Heart Rate (bpm): 141 Fundal Height: 34 cm Movement: Present     General:  Alert, oriented and cooperative. Patient is in no acute distress.  Skin: Skin is warm and dry. No rash noted.   Cardiovascular: Normal heart rate noted  Respiratory: Normal respiratory effort, no problems with respiration noted  Abdomen: Soft, gravid, appropriate for  gestational age.  Pain/Pressure: Absent     Pelvic: Cervical exam deferred        Extremities: Normal range of motion.  Edema: None  Mental Status: Normal mood and affect. Normal behavior. Normal judgment and thought content.   Assessment and Plan:  Pregnancy: G7P6006 at [redacted]w[redacted]d  1. Supervision of high risk pregnancy in first trimester For fatigue, suggest limiting caffeine to smaller portions earlier in day, warm bath or warm compresses to neck/back or warm bath to relax muscles before bed. May try Benadryl 25mg  qhs if other measures not helping. Anticipatory guidance re: 36-wk labs at RV. - Hemoglobin, fingerstick  2. Anemia affecting pregnancy in second trimester Hbg 11.1 today, doing well, continue oral iron. - Hemoglobin, fingerstick  3. Acute pyelonephritis 05/02/21 with hospitalization IV Rocephin Asymptomatic. Reviewed UC&S from last visit: <10k   Preterm labor symptoms and general obstetric precautions including but not limited to vaginal bleeding, contractions, leaking of fluid and fetal movement were reviewed in detail with the patient. Please refer to After Visit Summary for other counseling recommendations.  Return in about 1 week (around 10/25/2021) for Routine prenatal care, 36 wk labs.  Future Appointments  Date Time Provider Department Center  10/25/2021  2:00 PM [facility] PROVIDER AC-[facility] None    13/02/2021, PA-C [facility]

## 2021-10-18 NOTE — Progress Notes (Signed)
Patient here for MH RV at 35 1/7, here with two of her children. Hgb check today. 36 week labs next visit.Burt Knack, RN

## 2021-10-25 ENCOUNTER — Encounter: Payer: Self-pay | Admitting: Nurse Practitioner

## 2021-10-25 ENCOUNTER — Other Ambulatory Visit: Payer: Self-pay

## 2021-10-25 ENCOUNTER — Ambulatory Visit: Payer: Self-pay | Admitting: Nurse Practitioner

## 2021-10-25 VITALS — BP 104/66 | HR 78 | Temp 96.8°F | Wt 148.0 lb

## 2021-10-25 DIAGNOSIS — O99012 Anemia complicating pregnancy, second trimester: Secondary | ICD-10-CM

## 2021-10-25 DIAGNOSIS — O0991 Supervision of high risk pregnancy, unspecified, first trimester: Secondary | ICD-10-CM

## 2021-10-25 DIAGNOSIS — Z3403 Encounter for supervision of normal first pregnancy, third trimester: Secondary | ICD-10-CM

## 2021-10-25 DIAGNOSIS — O0993 Supervision of high risk pregnancy, unspecified, third trimester: Secondary | ICD-10-CM

## 2021-10-25 DIAGNOSIS — O99013 Anemia complicating pregnancy, third trimester: Secondary | ICD-10-CM

## 2021-10-25 DIAGNOSIS — N949 Unspecified condition associated with female genital organs and menstrual cycle: Secondary | ICD-10-CM

## 2021-10-25 DIAGNOSIS — F32A Depression, unspecified: Secondary | ICD-10-CM

## 2021-10-25 NOTE — Progress Notes (Signed)
[place] referral received today and faxed to [facility] with ok confirmation.   Floy Sabina, RN

## 2021-10-25 NOTE — Progress Notes (Signed)
[place] [facility]  PRENATAL VISIT NOTE  Subjective:  Sherri Vasquez is a 30 y.o. G7P6006 at [redacted]w[redacted]d being seen today for ongoing prenatal care.  She is currently monitored for the following issues for this high-risk pregnancy and has Low birth weight infant x2 at term (06/09/2015 5 lbs, 03/24/2011 5 lbs); Grand multipara G7P6; Supervision of high risk pregnancy in first trimester; Elevated blood pressure affecting pregnancy in first trimester, antepartum; Poor historian; Acute pyelonephritis 05/02/21 with hospitalization IV Rocephin; Nephrolithiasis 05/02/21; Abnormal Pap smear of cervix 05/01/21; Susceptible to varicella (non-immune), currently pregnant; UTI (urinary tract infection) during pregnancy 05/01/21 >100,000 E. Coli; History of pyelonephritis 2019 with hospitalization in [place] x 5 days; and Anemia affecting pregnancy in second trimester on their problem list.  Patient reports back pain for 2 days.  Pain is located in the lower abdomen and radiates to the back.  Contractions: Not present. Vag. Bleeding: None.  Movement: Present. Denies leaking of fluid/ROM.   The following portions of the patient's history were reviewed and updated as appropriate: allergies, current medications, past family history, past medical history, past social history, past surgical history and problem list. Problem list updated.  Objective:   Vitals:   10/25/21 1342  BP: 104/66  Pulse: 78  Temp: (!) 96.8 F (36 C)  Weight: 148 lb (67.1 kg)    Fetal Status: Fetal Heart Rate (bpm): 140 Fundal Height: 36 cm Movement: Present  Presentation: Undeterminable  General:  Alert, oriented and cooperative. Patient is in no acute distress.  Skin: Skin is warm and dry. No rash noted.   Cardiovascular: Normal heart rate noted  Respiratory: Normal respiratory effort, no problems with respiration noted  Abdomen: Soft, gravid, appropriate for gestational age.  Pain/Pressure: Absent      Pelvic: Cervical exam deferred        Extremities: Normal range of motion.  Edema: None  Mental Status: Normal mood and affect. Normal behavior. Normal judgment and thought content.   Assessment and Plan:  Pregnancy: [id number] at [redacted]w[redacted]d  1. Supervision of high risk pregnancy in first trimester - -36 wk labs today. Chlamydia/GC and GBS.  -PHQ-9 score 9.  Patient states she is mostly tired from pregnancy, taking care of kids, and being uncomfortable.  Denies suicidal ideation.  Patient also notified of counseling services at health department.   -Reviewed s/sx of labor, when to go to hospital.   -Pt has carseat.  -Anticipatory guidance given regarding visits q1 wk until 40 wks.  -Attempted to determine presentation, unable.  Referral sent to Maternal Fetal Medicine to assess presentation.    - Culture, beta strep (group b only) - Chlamydia/GC NAA, Confirmation  2. Depression, unspecified depression type -PHQ-9 score 9.  Patient states she is mostly tired from pregnancy, taking care of kids, and being uncomfortable.  Denies suicidal ideation.  Patient also notified of counseling services at health department.   3. Round ligament pain  -Patient states she has lower abdominal pain that radiates to lower back.  Patient given a sheet of exercises to do at home.  Patient also given information sheet about round ligament pain.  Patient advised that the utilization of a lap belt may also relieve pressure.     4. Anemia - Patient continues to take iron.  Will recheck hgb on 11/18/21.    Term labor symptoms and general obstetric precautions including but not limited to vaginal bleeding, contractions, leaking of fluid and fetal movement were reviewed in detail with  the patient. Please refer to After Visit Summary for other counseling recommendations.   Return in about 1 week (around 11/01/2021) for Routine prenatal care visit.  Future Appointments  Date Time Provider Department Center   11/01/2021  2:20 PM AC-MH PROVIDER AC-[facility] None    Glenna Fellows, FNP

## 2021-10-26 ENCOUNTER — Telehealth: Payer: Self-pay

## 2021-10-26 NOTE — Telephone Encounter (Signed)
Call to patient regarding Ultrasound location needing to be in [place] as no appointments in [facility] are available. Patient verbalized she is okay with driving to [place].   Floy Sabina, RN   Call to patient again with appointment time for [facility] on November 09, 2021 at 1:00pm arrival time. Directions and address given to patient.   Floy Sabina, RN

## 2021-10-28 LAB — CHLAMYDIA/GC NAA, CONFIRMATION
Chlamydia trachomatis, NAA: NEGATIVE
Neisseria gonorrhoeae, NAA: NEGATIVE

## 2021-10-28 LAB — CULTURE, BETA STREP (GROUP B ONLY): Strep Gp B Culture: POSITIVE — AB

## 2021-10-29 DIAGNOSIS — B951 Streptococcus, group B, as the cause of diseases classified elsewhere: Secondary | ICD-10-CM | POA: Insufficient documentation

## 2021-10-30 ENCOUNTER — Inpatient Hospital Stay
Admission: EM | Admit: 2021-10-30 | Discharge: 2021-10-30 | Disposition: A | Discharge status: Home / Self Care | Payer: Self-pay | Attending: Obstetrics and Gynecology | Admitting: Obstetrics and Gynecology

## 2021-10-30 ENCOUNTER — Encounter: Payer: Self-pay | Admitting: Obstetrics and Gynecology

## 2021-10-30 DIAGNOSIS — B951 Streptococcus, group B, as the cause of diseases classified elsewhere: Secondary | ICD-10-CM

## 2021-10-30 DIAGNOSIS — Z2839 Other underimmunization status: Secondary | ICD-10-CM

## 2021-10-30 DIAGNOSIS — R1030 Lower abdominal pain, unspecified: Secondary | ICD-10-CM | POA: Insufficient documentation

## 2021-10-30 DIAGNOSIS — Z3A36 36 weeks gestation of pregnancy: Secondary | ICD-10-CM | POA: Insufficient documentation

## 2021-10-30 DIAGNOSIS — O99012 Anemia complicating pregnancy, second trimester: Secondary | ICD-10-CM

## 2021-10-30 DIAGNOSIS — O09899 Supervision of other high risk pregnancies, unspecified trimester: Secondary | ICD-10-CM

## 2021-10-30 DIAGNOSIS — O4703 False labor before 37 completed weeks of gestation, third trimester: Secondary | ICD-10-CM | POA: Insufficient documentation

## 2021-10-30 DIAGNOSIS — R519 Headache, unspecified: Secondary | ICD-10-CM | POA: Insufficient documentation

## 2021-10-30 DIAGNOSIS — Z3403 Encounter for supervision of normal first pregnancy, third trimester: Secondary | ICD-10-CM

## 2021-10-30 LAB — CBC
HCT: 31.7 % — ABNORMAL LOW (ref 36.0–46.0)
Hemoglobin: 10.4 g/dL — ABNORMAL LOW (ref 12.0–15.0)
MCH: 28.5 pg (ref 26.0–34.0)
MCHC: 32.8 g/dL (ref 30.0–36.0)
MCV: 86.8 fL (ref 80.0–100.0)
Platelets: 223 10*3/uL (ref 150–400)
RBC: 3.65 MIL/uL — ABNORMAL LOW (ref 3.87–5.11)
RDW: 13.2 % (ref 11.5–15.5)
WBC: 6.9 10*3/uL (ref 4.0–10.5)
nRBC: 0 % (ref 0.0–0.2)

## 2021-10-30 LAB — COMPREHENSIVE METABOLIC PANEL
ALT: 11 U/L (ref 0–44)
AST: 19 U/L (ref 15–41)
Albumin: 3 g/dL — ABNORMAL LOW (ref 3.5–5.0)
Alkaline Phosphatase: 139 U/L — ABNORMAL HIGH (ref 38–126)
Anion gap: 7 (ref 5–15)
BUN: <5 mg/dL — ABNORMAL LOW (ref 6–20)
CO2: 23 mmol/L (ref 22–32)
Calcium: 8.7 mg/dL — ABNORMAL LOW (ref 8.9–10.3)
Chloride: 106 mmol/L (ref 98–111)
Creatinine, Ser: 0.54 mg/dL (ref 0.44–1.00)
GFR, Estimated: >60 mL/min (ref >60)
Glucose, Bld: 81 mg/dL (ref 70–99)
Potassium: 3.8 mmol/L (ref 3.5–5.1)
Sodium: 136 mmol/L (ref 135–145)
Total Bilirubin: 0.6 mg/dL (ref 0.3–1.2)
Total Protein: 6.6 g/dL (ref 6.5–8.1)

## 2021-10-30 LAB — PROTEIN / CREATININE RATIO, URINE
Creatinine, Urine: 28 mg/dL
Total Protein, Urine: <6 mg/dL

## 2021-10-30 MED ORDER — CALCIUM CARBONATE ANTACID 500 MG PO CHEW: 2.0000 | CHEWABLE_TABLET | ORAL | Status: DC | PRN

## 2021-10-30 MED ORDER — ACETAMINOPHEN 500 MG PO TABS
1000.0000 mg | ORAL_TABLET | Freq: Four times a day (QID) | ORAL | Status: DC | PRN
  Administered 2021-10-30: 1000 mg via ORAL
  Filled 2021-10-30: qty 2

## 2021-10-30 MED ORDER — ZOLPIDEM TARTRATE 5 MG PO TABS: 5.0000 mg | ORAL_TABLET | Freq: Every evening | ORAL | 0 refills | Status: DC | PRN

## 2021-10-30 MED ORDER — ACETAMINOPHEN 325 MG PO TABS: 650.0000 mg | ORAL_TABLET | ORAL | Status: DC | PRN

## 2021-10-30 NOTE — Progress Notes (Signed)
Pt discharged home per order Schuman,MD .  Pt stable and ambulatory and an After Visit Summary was printed and given to the patient. Discharge education completed with patient/family including follow up instructions, appointments, and medication list. Pt was given paper prescription for Ambien to be filled/picked up at pharmacy. Pt received labor and bleeding precautions. Patient able to verbalize understanding, all questions fully answered upon discharge. Patient instructed to return to ED, call 911, or call MD for any changes in condition. Pt discharged home via personal vehicle with support person.

## 2021-10-30 NOTE — OB Triage Note (Signed)
Pt Sherri Vasquez 30 y.o. presents to labor and delivery triage reporting decreased fetal movement and back pain. Pt is a G7P6006 at [redacted]w[redacted]d . Pt denies signs and symptons consistent with rupture of membranes or active vaginal bleeding. Pt denies contractions and reports having occasional pelvic pressure/pain. Pt also reports headache that started 3 days ago and rates it 9/10, back pain 8/10, blurry vision that started this morning, pt did not take anything for pain today. External FM and TOCO applied to non-tender abdomen and assessing. Initial FHR 135 . Vital signs obtained and within normal limits. Provider notified of pt.

## 2021-10-30 NOTE — Discharge Summary (Signed)
Physician Discharge Summary  Patient ID: Sherri Vasquez MRN: [id number] DOB/AGE: 01-17-1991 30 y.o.  Admit date: 10/30/2021 Discharge date: 10/30/2021  Admission Diagnoses: Abdominal pain in pregnancy, Headache in pregnancy  Discharge Diagnoses:  Active Problems:   * No active hospital problems. *   Discharged Condition: good  Hospital Course: Patient presented to the hospital with complaints of lower abdominal pain.  She reports that the pain has come and gone throughout the evening and made it difficult for her to sleep.  She reports that she is also having a headache and vision changes.  She was evaluated for preeclampsia and work-up was negative.  Patient had normal blood pressures throughout her stay.  Her cervix was checked twice and she was found to be closed long and high.  Patient seems to be uncomfortable secondary to [facility] contractions.  She is feeling understandably fatigued from not sleeping overnight.  She reports she has tried Benadryl previously for sleep without success.  Will discharge home with Ambien 5 tablets total to use as needed for sleep.  Advised patient to only take 1 Ambien a day.  Consults: None  Significant Diagnostic Studies: labs: See EPIC  Treatments: none  Discharge Exam: Blood pressure 121/77, pulse 85, temperature 98.3 F (36.8 C), temperature source Oral, resp. rate 18, last menstrual period 02/05/2021. General appearance: alert, cooperative, and appears stated age Cardio: regular rate and rhythm, S1, S2 normal, no murmur, click, rub or gallop GI: soft, non-tender; bowel sounds normal; no masses,  no organomegaly Extremities: extremities normal, atraumatic, no cyanosis or edema Skin: Skin color, texture, turgor normal. No rashes or lesions  Disposition: Discharge disposition: 01-Home or Self Care        Allergies as of 10/30/2021   No Known Allergies      Medication List     TAKE these medications    Iron (Ferrous  Sulfate) 325 (65 Fe) MG Tabs Take 1 tablet by mouth daily at 6 (six) AM for 100 doses.   prenatal multivitamin Tabs tablet Take 1 tablet by mouth daily at 12 noon.   zolpidem 5 MG tablet Commonly known as: AMBIEN Take 1 tablet (5 mg total) by mouth at bedtime as needed for sleep.         Signed: Natale Milch 10/30/2021, 4:03 PM

## 2021-11-01 ENCOUNTER — Other Ambulatory Visit: Payer: Self-pay

## 2021-11-01 ENCOUNTER — Ambulatory Visit: Payer: Self-pay | Admitting: Physician Assistant

## 2021-11-01 VITALS — BP 119/71 | HR 91 | Temp 97.7°F | Wt 147.6 lb

## 2021-11-01 DIAGNOSIS — O99012 Anemia complicating pregnancy, second trimester: Secondary | ICD-10-CM

## 2021-11-01 DIAGNOSIS — Z3403 Encounter for supervision of normal first pregnancy, third trimester: Secondary | ICD-10-CM

## 2021-11-01 DIAGNOSIS — O99013 Anemia complicating pregnancy, third trimester: Secondary | ICD-10-CM

## 2021-11-01 DIAGNOSIS — N10 Acute tubulo-interstitial nephritis: Secondary | ICD-10-CM

## 2021-11-01 LAB — URINALYSIS
Bilirubin, UA: NEGATIVE
Glucose, UA: NEGATIVE
Ketones, UA: NEGATIVE
Nitrite, UA: NEGATIVE
Protein,UA: NEGATIVE
Specific Gravity, UA: 1.02 (ref 1.005–1.030)
Urobilinogen, Ur: 0.2 mg/dL (ref 0.2–1.0)
pH, UA: 7 (ref 5.0–7.5)

## 2021-11-01 NOTE — Progress Notes (Signed)
[facility] Health Department M[facility] Health Clinic  PRENATAL VISIT NOTE  Subjective:  Sherri Vasquez is a 30 y.o. G7P6006 at [redacted]w[redacted]d being seen today for ongoing prenatal care.  She is currently monitored for the following issues for this high-risk pregnancy and has Low birth weight infant x2 at term (06/09/2015 5 lbs, 03/24/2011 5 lbs); Grand multipara G7P6; Encounter for supervision of normal first pregnancy in third trimester; Elevated blood pressure affecting pregnancy in first trimester, antepartum; Poor historian; Acute pyelonephritis 05/02/21 with hospitalization IV Rocephin; Nephrolithiasis 05/02/21; Abnormal Pap smear of cervix 05/01/21; Susceptible to varicella (non-immune), currently pregnant; UTI (urinary tract infection) during pregnancy 05/01/21 >100,000 E. Coli; History of pyelonephritis 2019 with hospitalization in [place] x 5 days; Anemia affecting pregnancy in second trimester; and Positive GBS test on their problem list.  Patient reports no complaints and nocturia, improved since she went to community provider and reports she had baby turned .  Contractions: Irritability. Vag. Bleeding: None.  Movement: Present. Had ED visit for dec fetal movement and back pain eval 10/30/21 and was given 5 Ambien tablets. Did not have NST/AFI. Sleep is improved. Denies leaking of fluid/ROM.   The following portions of the patient's history were reviewed and updated as appropriate: allergies, current medications, past family history, past medical history, past social history, past surgical history and problem list. Problem list updated.  Objective:   Vitals:   11/01/21 1441  BP: 119/71  Pulse: 91  Temp: 97.7 F (36.5 C)  Weight: 147 lb 9.6 oz (67 kg)    Fetal Status: Fetal Heart Rate (bpm): 140 Fundal Height: 38 cm Movement: Present  Presentation: Vertex  General:  Alert, oriented and cooperative. Patient is in no acute distress.  Skin: Skin is warm and dry. No rash noted.    Cardiovascular: Normal heart rate noted  Respiratory: Normal respiratory effort, no problems with respiration noted  Abdomen: Soft, gravid, appropriate for gestational age.  Pain/Pressure: Absent     Pelvic: Cervical exam deferred        Extremities: Normal range of motion.  Edema: None  Mental Status: Normal mood and affect. Normal behavior. Normal judgment and thought content.   Assessment and Plan:  Pregnancy: G7P6006 at [redacted]w[redacted]d  1. Encounter for supervision of normal first pregnancy in third trimester Questionable fetal position at last visit, palpated as vertex per Leopold's today, enc pt to keep [place] as sched 11/09/21 for confirmation of fetal position. Reviewed neg GC, chlam tests from last visit, and pos GBS. - Urinalysis - Urine Culture  2. Acute pyelonephritis 05/02/21 with hospitalization IV Rocephin CCUA today shows trace blood, nit neg, tra LE, neg pro. U C&S pending.  3. Anemia affecting pregnancy in second trimester Continue oral iron.   Term labor symptoms and general obstetric precautions including but not limited to vaginal bleeding, contractions, leaking of fluid and fetal movement were reviewed in detail with the patient. Pt seen in partnership with [facility] resident Katherina Right, MD. Please refer to After Visit Summary for other counseling recommendations.  Return in about 1 week (around 11/08/2021) for Routine prenatal care.  Future Appointments  Date Time Provider Department Center  11/07/2021  2:20 PM [facility]-MH PROVIDER AC-MAT None  11/09/2021  1:00 PM [facility] NURSE [facility]  11/09/2021  1:15 PM [facility] US2 [facility]    Greenley Martone, PA-C

## 2021-11-01 NOTE — Progress Notes (Signed)
Patient here for MH RV at 37 1/7. Patient states she went to the ED on 10/30/2021 for decreased fetal movement and back pain. States feeling better now. Took ambien last night to help her sleep. GBS counseling done today and literature given. Per patient she visited a woman in [place] about a week ago, (sounds like lay midwife) who determined that her baby was lying transverse and tried to turn her baby. Patient states she thinks her baby is lying correctly now. States she is having lots of pressure in her abdomen.Burt Knack, RN

## 2021-11-04 LAB — URINE CULTURE: Organism ID, Bacteria: NO GROWTH

## 2021-11-07 ENCOUNTER — Ambulatory Visit: Payer: Self-pay | Admitting: Advanced Practice Midwife

## 2021-11-07 ENCOUNTER — Other Ambulatory Visit: Payer: Self-pay

## 2021-11-07 VITALS — BP 115/80 | HR 103 | Temp 97.6°F | Wt 149.4 lb

## 2021-11-07 DIAGNOSIS — R87618 Other abnormal cytological findings on specimens from cervix uteri: Secondary | ICD-10-CM

## 2021-11-07 DIAGNOSIS — Z3403 Encounter for supervision of normal first pregnancy, third trimester: Secondary | ICD-10-CM

## 2021-11-07 DIAGNOSIS — O99012 Anemia complicating pregnancy, second trimester: Secondary | ICD-10-CM

## 2021-11-07 DIAGNOSIS — N10 Acute tubulo-interstitial nephritis: Secondary | ICD-10-CM

## 2021-11-07 DIAGNOSIS — O99013 Anemia complicating pregnancy, third trimester: Secondary | ICD-10-CM

## 2021-11-07 DIAGNOSIS — Z641 Problems related to multiparity: Secondary | ICD-10-CM

## 2021-11-07 NOTE — Progress Notes (Signed)
Patient here for MH RV at 38 weeks. .Ernesteen Mihalic Brewer-Jensen, RN  °

## 2021-11-07 NOTE — Progress Notes (Signed)
[facility]  PRENATAL VISIT NOTE  Subjective:  Sherri Vasquez is a 30 y.o. G7P6006 at [redacted]w[redacted]d being seen today for ongoing prenatal care.  She is currently monitored for the following issues for this low-risk pregnancy and has Low birth weight infant x2 at term (06/09/2015 5 lbs, 03/24/2011 5 lbs); Grand multipara G7P6; Encounter for supervision of normal first pregnancy in third trimester; Elevated blood pressure affecting pregnancy in first trimester, antepartum; Poor historian; Acute pyelonephritis 05/02/21 with hospitalization IV Rocephin; Nephrolithiasis 05/02/21; Abnormal Pap smear of cervix 05/01/21; Susceptible to varicella (non-immune), currently pregnant; UTI (urinary tract infection) during pregnancy 05/01/21 >100,000 E. Coli; History of pyelonephritis 2019 with hospitalization in [place] x 5 days; Anemia affecting pregnancy in second trimester; and Positive GBS test on their problem list.  Patient reports fatigue.  Contractions: Not present. Vag. Bleeding: None.  Movement: Present. Denies leaking of fluid/ROM.   The following portions of the patient's history were reviewed and updated as appropriate: allergies, current medications, past family history, past medical history, past social history, past surgical history and problem list. Problem list updated.  Objective:   Vitals:   11/07/21 1413  BP: 115/80  Pulse: (!) 103  Temp: 97.6 F (36.4 C)  Weight: 149 lb 6.4 oz (67.8 kg)    Fetal Status: Fetal Heart Rate (bpm): 130 Fundal Height: 37 cm Movement: Present  Presentation: Vertex  General:  Alert, oriented and cooperative. Patient is in no acute distress.  Skin: Skin is warm and dry. No rash noted.   Cardiovascular: Normal heart rate noted  Respiratory: Normal respiratory effort, no problems with respiration noted  Abdomen: Soft, gravid, appropriate for gestational age.  Pain/Pressure: Absent     Pelvic: Cervical exam deferred         Extremities: Normal range of motion.  Edema: None  Mental Status: Normal mood and affect. Normal behavior. Normal judgment and thought content.   Assessment and Plan:  Pregnancy: G7P6006 at [redacted]w[redacted]d  1. Anemia affecting pregnancy in second trimester Taking I FeSo4 daily with juice  2. Other abnormal cytological finding of specimen from cervix 07/25/21 pap unsatisfactory for evaluation but HPV+:   needs colpo pp  3. Grand multipara G7P6   4. Low birth weight infant x2 at term (06/09/2015 5 lbs, 03/24/2011 5 lbs) Monitor fundal height. Has u/s 11/09/21  5. Encounter for supervision of normal first pregnancy in third trimester 19 lb 6.4 oz (8.8 kg) C/o miseries of pregnancy; suggestions given for backache and difficulty sleeping  6. Acute pyelonephritis 05/02/21 with hospitalization IV Rocephin 11/01/21 C&S neg   Term labor symptoms and general obstetric precautions including but not limited to vaginal bleeding, contractions, leaking of fluid and fetal movement were reviewed in detail with the patient. Please refer to After Visit Summary for other counseling recommendations.  No follow-ups on file.  Future Appointments  Date Time Provider Department Center  11/09/2021  1:00 PM [facility] NURSE [facility]  11/09/2021  1:15 PM [facility] US2 [facility]  11/14/2021  1:40 PM [facility] PROVIDER [facility] None    Alberteen Spindle, CNM

## 2021-11-09 ENCOUNTER — Other Ambulatory Visit: Payer: Self-pay

## 2021-11-09 ENCOUNTER — Ambulatory Visit: Payer: Self-pay | Admitting: *Deleted

## 2021-11-09 ENCOUNTER — Ambulatory Visit: Payer: Self-pay | Attending: Nurse Practitioner

## 2021-11-09 ENCOUNTER — Other Ambulatory Visit: Payer: Self-pay | Admitting: Nurse Practitioner

## 2021-11-09 ENCOUNTER — Encounter: Payer: Self-pay | Admitting: *Deleted

## 2021-11-09 VITALS — BP 117/79 | HR 88

## 2021-11-09 DIAGNOSIS — O99013 Anemia complicating pregnancy, third trimester: Secondary | ICD-10-CM | POA: Insufficient documentation

## 2021-11-09 DIAGNOSIS — O0991 Supervision of high risk pregnancy, unspecified, first trimester: Secondary | ICD-10-CM

## 2021-11-09 DIAGNOSIS — O09899 Supervision of other high risk pregnancies, unspecified trimester: Secondary | ICD-10-CM

## 2021-11-09 DIAGNOSIS — B951 Streptococcus, group B, as the cause of diseases classified elsewhere: Secondary | ICD-10-CM

## 2021-11-09 DIAGNOSIS — O99012 Anemia complicating pregnancy, second trimester: Secondary | ICD-10-CM

## 2021-11-09 DIAGNOSIS — Z2839 Other underimmunization status: Secondary | ICD-10-CM

## 2021-11-09 DIAGNOSIS — O2690 Pregnancy related conditions, unspecified, unspecified trimester: Secondary | ICD-10-CM | POA: Insufficient documentation

## 2021-11-09 DIAGNOSIS — O0943 Supervision of pregnancy with grand multiparity, third trimester: Secondary | ICD-10-CM | POA: Insufficient documentation

## 2021-11-09 DIAGNOSIS — Z3403 Encounter for supervision of normal first pregnancy, third trimester: Secondary | ICD-10-CM

## 2021-11-09 DIAGNOSIS — O0993 Supervision of high risk pregnancy, unspecified, third trimester: Secondary | ICD-10-CM | POA: Insufficient documentation

## 2021-11-09 DIAGNOSIS — Z3A38 38 weeks gestation of pregnancy: Secondary | ICD-10-CM | POA: Insufficient documentation

## 2021-11-14 ENCOUNTER — Ambulatory Visit: Payer: Self-pay | Admitting: Family Medicine

## 2021-11-14 ENCOUNTER — Other Ambulatory Visit: Payer: Self-pay

## 2021-11-14 VITALS — BP 118/73 | HR 95 | Temp 97.2°F | Wt 150.2 lb

## 2021-11-14 DIAGNOSIS — O99013 Anemia complicating pregnancy, third trimester: Secondary | ICD-10-CM

## 2021-11-14 DIAGNOSIS — B951 Streptococcus, group B, as the cause of diseases classified elsewhere: Secondary | ICD-10-CM

## 2021-11-14 DIAGNOSIS — O0993 Supervision of high risk pregnancy, unspecified, third trimester: Secondary | ICD-10-CM

## 2021-11-14 NOTE — Progress Notes (Signed)
Patient here for MH RV at 39 weeks. Patient had U/S on 11/09/2021 and states she was told baby about 8lbs. Patient states she is feeling very nervous about size of baby. C/O pressure during and after urination. Urine culture at last visit was negative.Burt Knack, RN

## 2021-11-14 NOTE — Progress Notes (Signed)
IOL referral faxed to [facility] with confirmation.Burt Knack, RN

## 2021-11-16 ENCOUNTER — Inpatient Hospital Stay
Admission: EM | Admit: 2021-11-16 | Discharge: 2021-11-16 | Disposition: A | Discharge status: Home / Self Care | Payer: Self-pay | Attending: Obstetrics & Gynecology | Admitting: Obstetrics & Gynecology

## 2021-11-16 ENCOUNTER — Other Ambulatory Visit: Payer: Self-pay

## 2021-11-16 ENCOUNTER — Encounter: Payer: Self-pay | Admitting: Obstetrics & Gynecology

## 2021-11-16 DIAGNOSIS — Z3403 Encounter for supervision of normal first pregnancy, third trimester: Secondary | ICD-10-CM

## 2021-11-16 DIAGNOSIS — Z3A39 39 weeks gestation of pregnancy: Secondary | ICD-10-CM

## 2021-11-16 DIAGNOSIS — B951 Streptococcus, group B, as the cause of diseases classified elsewhere: Secondary | ICD-10-CM

## 2021-11-16 DIAGNOSIS — O26893 Other specified pregnancy related conditions, third trimester: Secondary | ICD-10-CM

## 2021-11-16 DIAGNOSIS — O26899 Other specified pregnancy related conditions, unspecified trimester: Secondary | ICD-10-CM | POA: Diagnosis present

## 2021-11-16 DIAGNOSIS — Z2839 Other underimmunization status: Secondary | ICD-10-CM

## 2021-11-16 DIAGNOSIS — R1030 Lower abdominal pain, unspecified: Secondary | ICD-10-CM | POA: Diagnosis present

## 2021-11-16 DIAGNOSIS — O99012 Anemia complicating pregnancy, second trimester: Secondary | ICD-10-CM

## 2021-11-16 NOTE — Progress Notes (Signed)
Provider notified of pt arrival to unit. Provider wants to do a labor evaluation. RN will have pt walk and recheck cervix in one hour.

## 2021-11-16 NOTE — OB Triage Note (Signed)
Patient Discharged home per provider. Pt educated about labor precautions and informed when to return to the ED for further evaluation. Pt instructed to keep all follow up appointments with her provider. AVS given to patient and RN answered all questions and patient has no further questions at this time. Pt discharged home in stable condition. °

## 2021-11-16 NOTE — Final Progress Note (Signed)
Physician Final Progress Note  Patient ID: Sherri Vasquez MRN: [id number] DOB/AGE: 06-30-1991 30 y.o.  Admit date: 11/16/2021 Admitting provider: Nadara Mustard, MD Discharge date: 11/16/2021  Admission Diagnoses: Active Problems:   Pregnancy related abdominal pain of lower quadrant, antepartum   39 weeks  Discharge Diagnoses:  Active Problems:   Pregnancy related abdominal pain of lower quadrant, antepartum  39 weeks  Consults: None  Significant Findings/ Diagnostic Studies: Patient presented for evaluation of labor.  Patient had cervical exam by RN and this was reported to me. I reviewed her vital signs and fetal tracing, both of which were reassuring.  Patient was discharge as she was not laboring.   Procedures: A NST procedure was performed with FHR monitoring and a normal baseline established, appropriate time of 20-40 minutes of evaluation, and accels >2 seen w 15x15 characteristics.  Results show a REACTIVE NST.    Discharge Condition: good  Disposition: Discharge disposition: 01-Home or Self Care       Diet: Regular diet  Discharge Activity: Activity as tolerated  Discharge Instructions     Call MD for:   Complete by: As directed    Worsening contractions or pain; leakage of fluid; bleeding.   Diet - low sodium heart healthy   Complete by: As directed    Increase activity slowly   Complete by: As directed       Allergies as of 11/16/2021   No Known Allergies      Medication List     TAKE these medications    Iron (Ferrous Sulfate) 325 (65 Fe) MG Tabs Take 1 tablet by mouth daily at 6 (six) AM for 100 doses.   prenatal multivitamin Tabs tablet Take 1 tablet by mouth daily at 12 noon.         Total time spent taking care of this patient: TRIAGE  Signed: Letitia Libra 11/16/2021, 4:24 PM

## 2021-11-16 NOTE — Discharge Summary (Signed)
  See FPN 

## 2021-11-16 NOTE — OB Triage Note (Addendum)
Pt is a 30 yo G7P6006 at [redacted]w[redacted]d that presents from ED with c/o ctx that started yesterday and were every 10 minutes but spaced out overnight. Pt states this morning the ctx started again but are not regular. Pt states she had some spotting yesterday but now just has yellow discharge today. Pt also states she has burning and pressure when she urinates "it feels like something is going to come out." But had a negative UA on 11/01/21. Pt denies current VB and states positive FM. EFM applied and initial fht 135. Pt is asking for an Induction because she was told at her last [place] that the baby was 8lbs and she is scared of having a big baby. Will notify provider of pt arrival to unit.

## 2021-11-19 ENCOUNTER — Other Ambulatory Visit: Payer: Self-pay | Admitting: Obstetrics & Gynecology

## 2021-11-19 DIAGNOSIS — O0993 Supervision of high risk pregnancy, unspecified, third trimester: Secondary | ICD-10-CM | POA: Insufficient documentation

## 2021-11-19 DIAGNOSIS — Z3490 Encounter for supervision of normal pregnancy, unspecified, unspecified trimester: Secondary | ICD-10-CM

## 2021-11-19 NOTE — Progress Notes (Signed)
[facility]  PRENATAL VISIT NOTE  Subjective:  Sherri Vasquez is a 30 y.o. G7P6006 at [redacted]w[redacted]d being seen today for ongoing prenatal care.  She is currently monitored for the following issues for this high-risk pregnancy and has Low birth weight infant x2 at term (06/09/2015 5 lbs, 03/24/2011 5 lbs); Grand multipara G7P6; Encounter for supervision of normal first pregnancy in third trimester; Elevated blood pressure affecting pregnancy in first trimester, antepartum; Poor historian; Acute pyelonephritis 05/02/21 with hospitalization IV Rocephin; Nephrolithiasis 05/02/21; Abnormal Pap smear of cervix 05/01/21; Susceptible to varicella (non-immune), currently pregnant; UTI (urinary tract infection) during pregnancy 05/01/21 >100,000 E. Coli; History of pyelonephritis 2019 with hospitalization in [place] x 5 days; Anemia affecting pregnancy in second trimester; Positive GBS test; and Pregnancy related abdominal pain of lower quadrant, antepartum on their problem list.  Patient reports occasional contractions.  Contractions: Irritability. Vag. Bleeding: None.  Movement: Present. Denies leaking of fluid/ROM.   The following portions of the patient's history were reviewed and updated as appropriate: allergies, current medications, past family history, past medical history, past social history, past surgical history and problem list. Problem list updated.  Objective:   Vitals:   11/14/21 1413  BP: 118/73  Pulse: 95  Temp: (!) 97.2 F (36.2 C)  Weight: 150 lb 3.2 oz (68.1 kg)    Fetal Status: Fetal Heart Rate (bpm): 160 Fundal Height: 38 cm Movement: Present  Presentation: Vertex  General:  Alert, oriented and cooperative. Patient is in no acute distress.  Skin: Skin is warm and dry. No rash noted.   Cardiovascular: Normal heart rate noted  Respiratory: Normal respiratory effort, no problems with respiration noted  Abdomen: Soft, gravid, appropriate for  gestational age.  Pain/Pressure: Present     Pelvic: Cervical exam performed Dilation: 2 Effacement (%): 50 Station: 0  Extremities: Normal range of motion.  Edema: None  Mental Status: Normal mood and affect. Normal behavior. Normal judgment and thought content.   Assessment and Plan:  Pregnancy: G7P6006 at [redacted]w[redacted]d  1. Supervision of high risk pregnancy in third trimester -taking PNV as directed  Reviewed 11/09/21 [place] -IOL paper completed today  -Discussed membrane sweeping today. Reviewed cochrane review data on membrane sweeping at 39 wks and then at EDD. Reviewed risk of cramping, contractions, bleeding and ROM. Answered patient questions and she agreed to proceed with procedure.  D/t closures for holiday, pt encouraged to go to ER if any problems or concerns or if labor starts.    2. Positive GBS test Pt questions answered   3. Anemia affecting pregnancy in third trimester Pt taking FeSo4 as directed last hgb was 10.4    Term labor symptoms and general obstetric precautions including but not limited to vaginal bleeding, contractions, leaking of fluid and fetal movement were reviewed in detail with the patient. Please refer to After Visit Summary for other counseling recommendations.  No follow-ups on file.  Future Appointments  Date Time Provider Department Center  11/21/2021  2:20 PM [facility] PROVIDER [facility] None   Pt declined spanish interpreter.    Wendi Snipes, FNP

## 2021-11-20 ENCOUNTER — Ambulatory Visit: Payer: Self-pay | Admitting: Advanced Practice Midwife

## 2021-11-20 ENCOUNTER — Telehealth: Payer: Self-pay | Admitting: Family Medicine

## 2021-11-20 ENCOUNTER — Other Ambulatory Visit: Payer: Self-pay

## 2021-11-20 VITALS — BP 112/70 | HR 83 | Temp 97.6°F | Wt 150.4 lb

## 2021-11-20 DIAGNOSIS — O99012 Anemia complicating pregnancy, second trimester: Secondary | ICD-10-CM

## 2021-11-20 DIAGNOSIS — O0993 Supervision of high risk pregnancy, unspecified, third trimester: Secondary | ICD-10-CM

## 2021-11-20 DIAGNOSIS — Z641 Problems related to multiparity: Secondary | ICD-10-CM

## 2021-11-20 DIAGNOSIS — O99013 Anemia complicating pregnancy, third trimester: Secondary | ICD-10-CM

## 2021-11-20 LAB — URINALYSIS
Bilirubin, UA: NEGATIVE
Glucose, UA: NEGATIVE
Ketones, UA: NEGATIVE
Nitrite, UA: NEGATIVE
Protein,UA: NEGATIVE
Specific Gravity, UA: 1.015 (ref 1.005–1.030)
Urobilinogen, Ur: 0.2 mg/dL (ref 0.2–1.0)
pH, UA: 6.5 (ref 5.0–7.5)

## 2021-11-20 NOTE — Telephone Encounter (Signed)
TC to patient who states she is feeling "a lot of pain" in her back, legs, and abdomen. Patient states she went to the hospital on 11/16/21 and they sent her home. States her baby was moving yesterday but she didn't count how many times and she really wants to be seen today instead of tomorrow. Patient states she has drunk hot chocolate this morning and nothing else. Counseled to eat a good meal and drink fluids and pay attention and count her baby's movements and to go to the hospital if he's not moving regularly this morning. Patient insistent on being seen asap. Counseled we have no open appointments today, and that if she arrives at 12:45 we will work her in. Counseled she may have to wait a little but we will work her in. Scheduled for this afternoon.Burt Knack, RN

## 2021-11-20 NOTE — Progress Notes (Signed)
Patient here at 77 6/7 c/o back pain across her low back, contractions and groin pain, pain with urination. Unable to sleep and relax, pain in left ankle, thighs painful.Burt Knack, RN

## 2021-11-20 NOTE — Telephone Encounter (Signed)
Pt. has f/u appointment tomorrow but she is not feeling well and would really like to be seen today.    She says that she has been having contractions off and on since Thanksgiving.  She went to the ER but they sent her home to walk and said that they couldn't induce her without an authorization from her provider.At that time they told her she was only dilated to a "1".  She is feeling back pain, pain and pressure in her lower abdomen and with urination.  Her legs are also hurting a lot and she feels she can't keep walking like they told her to.   She is also concerned that she doesn't feel the baby moving as much as she used to.  There were no appointments available today to reschedule her to today.   Please call.

## 2021-11-20 NOTE — Progress Notes (Signed)
[facility]  PRENATAL VISIT NOTE  Subjective:  Sherri Vasquez is a 30 y.o. G7P6006 at [redacted]w[redacted]d being seen today for ongoing prenatal care.  She is currently monitored for the following issues for this low-risk pregnancy and has Low birth weight infant x2 at term (06/09/2015 5 lbs, 03/24/2011 5 lbs); Grand multipara G7P6; Encounter for supervision of normal first pregnancy in third trimester; Elevated blood pressure affecting pregnancy in first trimester, antepartum; Poor historian; Acute pyelonephritis 05/02/21 with hospitalization IV Rocephin; Nephrolithiasis 05/02/21; Abnormal Pap smear of cervix 05/01/21; Susceptible to varicella (non-immune), currently pregnant; UTI (urinary tract infection) during pregnancy 05/01/21 >100,000 E. Coli; History of pyelonephritis 2019 with hospitalization in [place] x 5 days; Anemia affecting pregnancy in second trimester; Positive GBS test; Pregnancy related abdominal pain of lower quadrant, antepartum; and Supervision of high risk pregnancy in third trimester on their problem list.  Patient reports contractions since 11/16/21 .  Contractions: Irregular. Vag. Bleeding: None.  Movement: Present. Denies leaking of fluid/ROM.   The following portions of the patient's history were reviewed and updated as appropriate: allergies, current medications, past family history, past medical history, past social history, past surgical history and problem list. Problem list updated.  Objective:   Vitals:   11/20/21 1318  BP: 112/70  Pulse: 83  Temp: 97.6 F (36.4 C)  Weight: 150 lb 6.4 oz (68.2 kg)    Fetal Status: Fetal Heart Rate (bpm): 150   Movement: Present  Presentation: Vertex  General:  Alert, oriented and cooperative. Patient is in no acute distress.  Skin: Skin is warm and dry. No rash noted.   Cardiovascular: Normal heart rate noted  Respiratory: Normal respiratory effort, no problems with respiration noted  Abdomen:  Soft, gravid, appropriate for gestational age.  Pain/Pressure: Present     Pelvic: Cervical exam performed Dilation: 3 Effacement (%): 30 Station: -2  Extremities: Normal range of motion.  Edema: None  Mental Status: Normal mood and affect. Normal behavior. Normal judgment and thought content.   Assessment and Plan:  Pregnancy: G7P6006 at [redacted]w[redacted]d  1. Supervision of high risk pregnancy in third trimester C/o u/c's and discomfort since 11/16/21. Went to L&D 11/16/21 with no cervical change and 2 cm, reactive NST and sent home. Pt is miserable, unable to sleep with irregular u/c's, painful left ankle with old injury, painful thighs,groin pain, low back ache, voiding small amts. IOL 11/29/21.  To keep apt tomorrow if not delivered Suggestions given Knows when to go to L&D - Urine Culture & Sensitivity - Urinalysis (Urine Dip)  2. Grand multipara G7P6   3. Anemia affecting pregnancy in second trimester Taking I FeSo4 daily with juice   Term labor symptoms and general obstetric precautions including but not limited to vaginal bleeding, contractions, leaking of fluid and fetal movement were reviewed in detail with the patient. Please refer to After Visit Summary for other counseling recommendations.  No follow-ups on file.  Future Appointments  Date Time Provider Department Center  11/21/2021  2:20 PM [facility] PROVIDER [facility] None    Alberteen Spindle, CNM

## 2021-11-20 NOTE — Progress Notes (Signed)
Patient given reminder card for [facility] RV tomorrow.Burt Knack, RN

## 2021-11-21 ENCOUNTER — Ambulatory Visit: Payer: Self-pay | Admitting: Advanced Practice Midwife

## 2021-11-21 VITALS — BP 114/71 | HR 86 | Temp 97.8°F | Wt 149.8 lb

## 2021-11-21 DIAGNOSIS — O0993 Supervision of high risk pregnancy, unspecified, third trimester: Secondary | ICD-10-CM

## 2021-11-21 DIAGNOSIS — O99013 Anemia complicating pregnancy, third trimester: Secondary | ICD-10-CM

## 2021-11-21 DIAGNOSIS — Z641 Problems related to multiparity: Secondary | ICD-10-CM

## 2021-11-21 DIAGNOSIS — O99012 Anemia complicating pregnancy, second trimester: Secondary | ICD-10-CM

## 2021-11-21 NOTE — Progress Notes (Signed)
[facility]  PRENATAL VISIT NOTE  Subjective:  Sherri Vasquez is a 30 y.o. G7P6006 at [redacted]w[redacted]d being seen today for ongoing prenatal care.  She is currently monitored for the following issues for this high-risk pregnancy and has Low birth weight infant x2 at term (06/09/2015 5 lbs, 03/24/2011 5 lbs); Grand multipara G7P6; Encounter for supervision of normal first pregnancy in third trimester; Elevated blood pressure affecting pregnancy in first trimester, antepartum; Poor historian; Acute pyelonephritis 05/02/21 with hospitalization IV Rocephin; Nephrolithiasis 05/02/21; Abnormal Pap smear of cervix 05/01/21; Susceptible to varicella (non-immune), currently pregnant; UTI (urinary tract infection) during pregnancy 05/01/21 >100,000 E. Coli; History of pyelonephritis 2019 with hospitalization in [place] x 5 days; Anemia affecting pregnancy in second trimester; Positive GBS test; Pregnancy related abdominal pain of lower quadrant, antepartum; and Supervision of high risk pregnancy in third trimester on their problem list.  Patient reports  prodromal labor with back pain, irregular contractions, can't sleep .  Contractions: Irregular. Vag. Bleeding: None.  Movement: Present.  Denies leaking of fluid/ROM.   The following portions of the patient's history were reviewed and updated as appropriate: allergies, current medications, past family history, past medical history, past social history, past surgical history and problem list. Problem list updated.  Objective:   Vitals:   11/21/21 1434  BP: 114/71  Pulse: 86  Temp: 97.8 F (36.6 C)  Weight: 149 lb 12.8 oz (67.9 kg)    Fetal Status: Fetal Heart Rate (bpm): 130 Fundal Height: 38 cm Movement: Present  Presentation: Vertex  General:  Alert, oriented and cooperative. Patient is in no acute distress.  Skin: Skin is warm and dry. No rash noted.   Cardiovascular: Normal heart rate noted  Respiratory: Normal  respiratory effort, no problems with respiration noted  Abdomen: Soft, gravid, appropriate for gestational age.  Pain/Pressure: Present     Pelvic: Cervical exam performed Dilation: 3.5 Effacement (%): 50 Station: -2  Extremities: Normal range of motion.  Edema: None  Mental Status: Normal mood and affect. Normal behavior. Normal judgment and thought content.   Assessment and Plan:  Pregnancy: G7P6006 at [redacted]w[redacted]d  1. Supervision of high risk pregnancy in third trimester Membranes were stripped yesterday  and today with good results. Posterior, 50%, 3-4 cm, vtx -3 Suggestions given for back labor, comfort May need therapeutic rest tonight as pt is not able to sleep since 11/16/21 and is not coping well; encouraged to go to L&D if unable to sleep tonight and not in active labor  2. Grand multipara G7P6   3. Anemia affecting pregnancy in second trimester Taking I FeSo4 daily with oj   Term labor symptoms and general obstetric precautions including but not limited to vaginal bleeding, contractions, leaking of fluid and fetal movement were reviewed in detail with the patient. Please refer to After Visit Summary for other counseling recommendations.  Return in about 2 days (around 11/23/2021) for routine PNC.  No future appointments.  Alberteen Spindle, CNM

## 2021-11-21 NOTE — Progress Notes (Signed)
Patient back for second appointment this week. 40 weeks, IOL scheduled. Here for membrane sweeping.Burt Knack, RN

## 2021-11-22 ENCOUNTER — Ambulatory Visit: Payer: Self-pay | Admitting: Physician Assistant

## 2021-11-22 ENCOUNTER — Encounter: Payer: Self-pay | Admitting: Obstetrics & Gynecology

## 2021-11-22 ENCOUNTER — Other Ambulatory Visit: Payer: Self-pay

## 2021-11-22 ENCOUNTER — Observation Stay
Admission: EM | Admit: 2021-11-22 | Discharge: 2021-11-22 | Disposition: A | Discharge status: Home / Self Care | Payer: Self-pay | Attending: Obstetrics & Gynecology | Admitting: Obstetrics & Gynecology

## 2021-11-22 ENCOUNTER — Encounter: Payer: Self-pay | Admitting: Physician Assistant

## 2021-11-22 VITALS — BP 122/83 | HR 88 | Temp 97.7°F | Wt 150.6 lb

## 2021-11-22 DIAGNOSIS — O26893 Other specified pregnancy related conditions, third trimester: Principal | ICD-10-CM | POA: Insufficient documentation

## 2021-11-22 DIAGNOSIS — O26899 Other specified pregnancy related conditions, unspecified trimester: Secondary | ICD-10-CM | POA: Diagnosis present

## 2021-11-22 DIAGNOSIS — Z3A40 40 weeks gestation of pregnancy: Secondary | ICD-10-CM | POA: Insufficient documentation

## 2021-11-22 DIAGNOSIS — M545 Low back pain, unspecified: Secondary | ICD-10-CM | POA: Diagnosis present

## 2021-11-22 DIAGNOSIS — O0993 Supervision of high risk pregnancy, unspecified, third trimester: Secondary | ICD-10-CM

## 2021-11-22 DIAGNOSIS — O480 Post-term pregnancy: Secondary | ICD-10-CM | POA: Insufficient documentation

## 2021-11-22 DIAGNOSIS — O99012 Anemia complicating pregnancy, second trimester: Secondary | ICD-10-CM

## 2021-11-22 DIAGNOSIS — O99013 Anemia complicating pregnancy, third trimester: Secondary | ICD-10-CM

## 2021-11-22 LAB — URINALYSIS, ROUTINE W REFLEX MICROSCOPIC
Bilirubin Urine: NEGATIVE
Glucose, UA: NEGATIVE mg/dL
Ketones, ur: NEGATIVE mg/dL
Nitrite: NEGATIVE
Protein, ur: NEGATIVE mg/dL
Specific Gravity, Urine: 1.012 (ref 1.005–1.030)
pH: 7 (ref 5.0–8.0)

## 2021-11-22 LAB — URINE CULTURE: Organism ID, Bacteria: NO GROWTH

## 2021-11-22 MED ORDER — ACETAMINOPHEN 325 MG PO TABS
650.0000 mg | ORAL_TABLET | ORAL | Status: DC | PRN
  Administered 2021-11-22: 650 mg via ORAL
  Filled 2021-11-22: qty 2

## 2021-11-22 MED ORDER — ONDANSETRON HCL 4 MG/2ML IJ SOLN: 4.0000 mg | Freq: Four times a day (QID) | INTRAMUSCULAR | Status: DC | PRN

## 2021-11-22 MED ORDER — LIDOCAINE HCL (PF) 1 % IJ SOLN: 30.0000 mL | INTRAMUSCULAR | Status: DC | PRN

## 2021-11-22 NOTE — Progress Notes (Signed)
Aware of [facility] IOL appt 11/29/2021 at 0800. Jossie Ng, RN

## 2021-11-22 NOTE — OB Triage Note (Signed)
Presents with complaint of lower back 9/10 that is constant and occasional contractions.Denies leaking of fluid, occasional spotting noted per patient. EFMs applied.

## 2021-11-22 NOTE — Progress Notes (Signed)
[facility]  PRENATAL VISIT NOTE  Subjective:  Sherri Vasquez is a 30 y.o. G7P6006 at [redacted]w[redacted]d being seen today for ongoing prenatal care.  She is currently monitored for the following issues for this low-risk pregnancy and has Low birth weight infant x2 at term (06/09/2015 5 lbs, 03/24/2011 5 lbs); Grand multipara G7P6; Encounter for supervision of normal first pregnancy in third trimester; Elevated blood pressure affecting pregnancy in first trimester, antepartum; Poor historian; Acute pyelonephritis 05/02/21 with hospitalization IV Rocephin; Nephrolithiasis 05/02/21; Abnormal Pap smear of cervix 05/01/21; Susceptible to varicella (non-immune), currently pregnant; UTI (urinary tract infection) during pregnancy 05/01/21 >100,000 E. Coli; History of pyelonephritis 2019 with hospitalization in [place] x 5 days; Anemia affecting pregnancy in second trimester; Positive GBS test; Pregnancy related abdominal pain of lower quadrant, antepartum; Supervision of high risk pregnancy in third trimester; and Post-term pregnancy, 40-42 weeks of gestation on their problem list.  Patient reports  ongoing sciatic back pain, trouble sleeping and irreg UCs .  Contractions: Irregular. Vag. Bleeding: None.  Movement: Present. Denies leaking of fluid/ROM.   The following portions of the patient's history were reviewed and updated as appropriate: allergies, current medications, past family history, past medical history, past social history, past surgical history and problem list. Problem list updated.  Objective:   Vitals:   11/22/21 1315  BP: 122/83  Pulse: 88  Temp: 97.7 F (36.5 C)  Weight: 150 lb 9.6 oz (68.3 kg)    Fetal Status: Fetal Heart Rate (bpm): 148   Movement: Present  Presentation: Vertex  General:  Alert, oriented and cooperative. Patient is in no acute distress.  Skin: Skin is warm and dry. No rash noted.   Cardiovascular: Normal heart rate noted   Respiratory: Normal respiratory effort, no problems with respiration noted  Abdomen: Soft, gravid, appropriate for gestational age.  Pain/Pressure: Present     Pelvic: Cervical exam performed Dilation: 4 Effacement (%): 50 Station: -2  Extremities: Normal range of motion.  Edema: None  Mental Status: Normal mood and affect. Normal behavior. Normal judgment and thought content.   Assessment and Plan:  Pregnancy: G7P6006 at [redacted]w[redacted]d  1. Supervision of high risk pregnancy in third trimester   2. Post-term pregnancy, 40-42 weeks of gestation Membrane sweeping performed again today, cervix slightly more favorable than yest. Pt very interested in IOL due to significant discomfort. I paged Dr. Tiburcio Pea at [facility] who was able to resched IOL to Sat 12/3 at 8am. Pt to present to [facility] sooner if labor sx progress, ROM, dec FM. Pt states understanding.  3. Anemia affecting pregnancy in second trimester Continue oral iron.   Term labor symptoms and general obstetric precautions including but not limited to vaginal bleeding, contractions, leaking of fluid and fetal movement were reviewed in detail with the patient. Please refer to After Visit Summary for other counseling recommendations.  Return in about 7 weeks (around 01/10/2022) for post partum exam.  No future appointments.  Landry Dyke, PA-C

## 2021-11-22 NOTE — Final Progress Note (Signed)
Physician Final Progress Note  Patient ID: Sherri Vasquez MRN: [id number] DOB/AGE: [birth year] 32 y.o.  Admit date: 11/22/2021 Admitting provider: Nadara Mustard, MD Discharge date: 11/22/2021  Admission Diagnoses: Pain in abdomen, 40 weeks pregnancy, low back pain  Discharge Diagnoses:  Principal Problem:   Low back pain during pregnancy  same  Consults: None  Significant Findings/ Diagnostic Studies: Patient presented for evaluation of labor.  Patient had cervical exam by RN and this was reported to me. I reviewed her vital signs and fetal tracing, both of which were reassuring.  Patient was discharge as she was not laboring.  Procedures: A NST procedure was performed with FHR monitoring and a normal baseline established, appropriate time of 20-40 minutes of evaluation, and accels >2 seen w 15x15 characteristics.  Results show a REACTIVE NST.   Discharge Condition: good  Disposition:  There are no questions and answers to display.        Diet: Regular diet  Discharge Activity: Activity as tolerated    Total time spent taking care of this patient: TRIAGE  Signed: Letitia Libra 11/22/2021, 5:03 PM

## 2021-11-22 NOTE — Discharge Summary (Signed)
  See FPN 

## 2021-11-26 ENCOUNTER — Other Ambulatory Visit: Payer: Self-pay | Admitting: Advanced Practice Midwife

## 2021-11-26 ENCOUNTER — Encounter: Payer: Self-pay | Admitting: Advanced Practice Midwife

## 2021-11-26 ENCOUNTER — Ambulatory Visit: Payer: Self-pay

## 2021-11-26 DIAGNOSIS — O480 Post-term pregnancy: Secondary | ICD-10-CM

## 2021-11-26 NOTE — Progress Notes (Signed)
30 yo SHF G7P6 with prodromal labor >1 wk, unsuccessful membrane sweeping x 3. IOL at [facility] 11/24/21 but sent home with reactive NST and cancelled IOL due to not enough beds. Rescheduled IOL for 12/02/21. So pt went to [facility] . Given therapeutic rest on 11/24/21 at [facility] with u/c's q 5-10 min apart and 30%/3/-1. Was being sent home and pt admitted SI to RN so they kept her for eIOL at 1300. SVD 11/25/21 at 40 3/7 M. BTL done.

## 2021-12-06 ENCOUNTER — Telehealth: Payer: Self-pay

## 2021-12-06 NOTE — Telephone Encounter (Signed)
TC to patient who missed her appt today for mood check PP. Patient states she is doing much better, pain is gone and she is sleeping well every night. Patient states social worker is coming to deliver diapers tomorrow. Patient states she is feeling happy and does not feel like she needs to come to clinic at this time. Patient offered to be rescheduled, but declines. Patient counseled to call after the holidays for her 6 week PP appt. She will need to schedule around mid January. Patient states understanding and counseled to call back if she has concerns.Marland KitchenMarland KitchenMarland KitchenBurt Knack, RN

## 2022-01-22 ENCOUNTER — Ambulatory Visit: Payer: Self-pay

## 2022-01-29 ENCOUNTER — Ambulatory Visit: Payer: Self-pay

## 2022-01-29 ENCOUNTER — Telehealth: Payer: Self-pay

## 2022-01-29 NOTE — Telephone Encounter (Signed)
Per client, unable to keep appt this pm for post-partum exam due to child ill and had to be picked up early from school. Appt rescheduled for 02/04/2022. Jossie Ng, RN

## 2022-02-04 ENCOUNTER — Encounter: Payer: Self-pay | Admitting: Advanced Practice Midwife

## 2022-02-04 ENCOUNTER — Other Ambulatory Visit: Payer: Self-pay

## 2022-02-04 ENCOUNTER — Ambulatory Visit: Payer: Self-pay | Admitting: Advanced Practice Midwife

## 2022-02-04 DIAGNOSIS — Z9851 Tubal ligation status: Secondary | ICD-10-CM | POA: Insufficient documentation

## 2022-02-04 DIAGNOSIS — R87618 Other abnormal cytological findings on specimens from cervix uteri: Secondary | ICD-10-CM

## 2022-02-04 LAB — HEMOGLOBIN, FINGERSTICK: Hemoglobin: 12.1 g/dL (ref 11.1–15.9)
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
## 2022-02-04 NOTE — Progress Notes (Signed)
Hgb reviewed, no treatment indicated..Annaclaire Walsworth Brewer-Jensen, RN  

## 2022-02-04 NOTE — Progress Notes (Signed)
Patient here for PP visit. SVD 11/24/2021, BTL.Marland KitchenBurt Knack, RN

## 2022-09-30 DIAGNOSIS — T7421XA Adult sexual abuse, confirmed, initial encounter: Secondary | ICD-10-CM | POA: Insufficient documentation

## 2022-09-30 LAB — POC URINE PREG, ED: Preg Test, Ur: NEGATIVE
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
## 2022-10-01 ENCOUNTER — Encounter: Payer: Self-pay | Admitting: Emergency Medicine

## 2022-10-01 ENCOUNTER — Emergency Department
Admission: EM | Admit: 2022-10-01 | Discharge: 2022-10-01 | Disposition: A | Discharge status: Home / Self Care | Payer: Self-pay | Attending: Emergency Medicine | Admitting: Emergency Medicine

## 2022-10-01 ENCOUNTER — Other Ambulatory Visit: Payer: Self-pay

## 2022-10-01 ENCOUNTER — Emergency Department: Payer: Self-pay

## 2022-10-01 DIAGNOSIS — T7421XA Adult sexual abuse, confirmed, initial encounter: Secondary | ICD-10-CM

## 2022-10-01 LAB — COMPREHENSIVE METABOLIC PANEL
ALT: 22 U/L (ref 0–44)
AST: 27 U/L (ref 15–41)
Albumin: 4.2 g/dL (ref 3.5–5.0)
Alkaline Phosphatase: 95 U/L (ref 38–126)
Anion gap: 8 (ref 5–15)
BUN: 10 mg/dL (ref 6–20)
CO2: 25 mmol/L (ref 22–32)
Calcium: 9.2 mg/dL (ref 8.9–10.3)
Chloride: 105 mmol/L (ref 98–111)
Creatinine, Ser: 0.71 mg/dL (ref 0.44–1.00)
GFR, Estimated: >60 mL/min (ref >60)
Glucose, Bld: 107 mg/dL — ABNORMAL HIGH (ref 70–99)
Potassium: 3.5 mmol/L (ref 3.5–5.1)
Sodium: 138 mmol/L (ref 135–145)
Total Bilirubin: 0.5 mg/dL (ref 0.3–1.2)
Total Protein: 7.3 g/dL (ref 6.5–8.1)

## 2022-10-01 LAB — CBC
HCT: 35.2 % — ABNORMAL LOW (ref 36.0–46.0)
Hemoglobin: 11 g/dL — ABNORMAL LOW (ref 12.0–15.0)
MCH: 26.8 pg (ref 26.0–34.0)
MCHC: 31.3 g/dL (ref 30.0–36.0)
MCV: 85.6 fL (ref 80.0–100.0)
Platelets: 358 10*3/uL (ref 150–400)
RBC: 4.11 MIL/uL (ref 3.87–5.11)
RDW: 15.2 % (ref 11.5–15.5)
WBC: 7.2 10*3/uL (ref 4.0–10.5)
nRBC: 0 % (ref 0.0–0.2)

## 2022-10-01 LAB — URINALYSIS, ROUTINE W REFLEX MICROSCOPIC
Bilirubin Urine: NEGATIVE
Glucose, UA: NEGATIVE mg/dL
Hgb urine dipstick: NEGATIVE
Ketones, ur: NEGATIVE mg/dL
Leukocytes,Ua: NEGATIVE
Nitrite: NEGATIVE
Protein, ur: NEGATIVE mg/dL
Specific Gravity, Urine: 1.009 (ref 1.005–1.030)
pH: 7 (ref 5.0–8.0)

## 2022-10-01 LAB — LIPASE, BLOOD: Lipase: 45 U/L (ref 11–51)
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
## 2022-10-01 NOTE — ED Notes (Signed)
[place] deputy here to speak with pt

## 2022-10-01 NOTE — Discharge Instructions (Signed)
   Thank you for choosing us for your health care today!  Please see your primary doctor this week for a follow up appointment.   If you do not have a primary doctor call the following clinics to establish care:  If you have insurance:  [facility] [phone] [address] [place] [address]   [facility]  [phone] [address] [place] [address]   If you do not have insurance:  [facility]  [phone] [address]  [place] [address]  Sometimes, in the early stages of certain disease courses it is difficult to detect in the emergency department evaluation -- so, it is important that you continue to monitor your symptoms and call your doctor right away or return to the emergency department if you develop any new or worsening symptoms.  It was my pleasure to care for you today.   Shimeka Bacot S. Frenchie Dangerfield, MD  

## 2023-06-15 ENCOUNTER — Other Ambulatory Visit: Payer: Self-pay

## 2023-06-15 ENCOUNTER — Emergency Department
Admission: EM | Admit: 2023-06-15 | Discharge: 2023-06-15 | Disposition: A | Discharge status: Home / Self Care | Payer: Self-pay | Attending: Emergency Medicine | Admitting: Emergency Medicine

## 2023-06-15 DIAGNOSIS — U071 COVID-19: Secondary | ICD-10-CM | POA: Insufficient documentation

## 2023-06-15 LAB — SARS CORONAVIRUS 2 BY RT PCR: SARS Coronavirus 2 by RT PCR: POSITIVE — AB

## 2023-06-15 MED ORDER — ACETAMINOPHEN 325 MG PO TABS
650.0000 mg | ORAL_TABLET | Freq: Once | ORAL | Status: AC
  Administered 2023-06-15: 650 mg via ORAL
  Filled 2023-06-15: qty 2

## 2023-06-15 MED ORDER — PREDNISONE 20 MG PO TABS
60.0000 mg | ORAL_TABLET | Freq: Once | ORAL | Status: AC
  Administered 2023-06-15: 60 mg via ORAL
  Filled 2023-06-15: qty 3

## 2023-06-15 MED ORDER — LIDOCAINE VISCOUS HCL 2 % MT SOLN
15.0000 mL | Freq: Once | OROMUCOSAL | Status: AC
  Administered 2023-06-15: 15 mL via OROMUCOSAL
  Filled 2023-06-15: qty 15

## 2023-06-15 MED ORDER — PSEUDOEPH-BROMPHEN-DM 30-2-10 MG/5ML PO SYRP: 10.0000 mL | ORAL_SOLUTION | Freq: Four times a day (QID) | ORAL | 0 refills | Status: DC | PRN

## 2023-06-15 MED ORDER — IBUPROFEN 600 MG PO TABS
600.0000 mg | ORAL_TABLET | Freq: Once | ORAL | Status: AC
  Administered 2023-06-15: 600 mg via ORAL
  Filled 2023-06-15: qty 1

## 2023-06-15 MED ORDER — PREDNISONE 50 MG PO TABS: 50.0000 mg | ORAL_TABLET | Freq: Every day | ORAL | 0 refills | Status: DC

## 2023-06-15 MED ORDER — LIDOCAINE VISCOUS HCL 2 % MT SOLN: 10.0000 mL | OROMUCOSAL | 0 refills | Status: DC | PRN

## 2023-06-15 MED ORDER — BENZONATATE 100 MG PO CAPS: 100.0000 mg | ORAL_CAPSULE | Freq: Three times a day (TID) | ORAL | 0 refills | Status: DC | PRN

## 2023-06-15 NOTE — ED Provider Notes (Signed)
[facility] Provider Note  Patient Contact: 8:49 PM (approximate)   History   Fever (Last night)   HPI  Sherri Vasquez is a 32 y.o. female who presents emergency department complaining of sore throat, fever, body aches since yesterday.  Patient has been taking antipyretics for fever.  No neck pain or stiffness, chest pain, shortness of breath, abdominal pain, urinary symptoms.  Patient sister has similar symptoms.     Physical Exam   Triage Vital Signs: ED Triage Vitals  Enc Vitals Group     BP 06/15/23 2013 (!) 150/99     Pulse Rate 06/15/23 2012 96     Resp 06/15/23 2012 20     Temp 06/15/23 2012 99 F (37.2 C)     Temp Source 06/15/23 2012 Oral     SpO2 06/15/23 2012 100 %     Weight 06/15/23 2012 136 lb 11 oz (62 kg)     Height 06/15/23 2012 5\' 1"  (1.549 m)     Head Circumference --      Peak Flow --      Pain Score 06/15/23 2012 0     Pain Loc --      Pain Edu? --      Excl. in GC? --     Most recent vital signs: Vitals:   06/15/23 2012 06/15/23 2013  BP:  (!) 150/99  Pulse: 96   Resp: 20   Temp: 99 F (37.2 C)   SpO2: 100%      General: Alert and in no acute distress. ENT:      Ears:       Nose: No congestion/rhinnorhea.      Mouth/Throat: Mucous membranes are moist.  Cardiovascular:  Good peripheral perfusion Respiratory: Normal respiratory effort without tachypnea or retractions. Lungs CTAB. Good air entry to the bases with no decreased or absent breath sounds. Gastrointestinal: Bowel sounds 4 quadrants. Soft and nontender to palpation. No guarding or rigidity. No palpable masses. No distention. No CVA tenderness. Musculoskeletal: Full range of motion to all extremities.  Neurologic:  No gross focal neurologic deficits are appreciated.  Skin:   No rash noted Other:   ED Results / Procedures / Treatments   Labs (all labs ordered are listed, but only abnormal results are displayed) Labs Reviewed  SARS  CORONAVIRUS 2 BY RT PCR - Abnormal; Notable for the following components:      Result Value   SARS Coronavirus 2 by RT PCR POSITIVE (*)    All other components within normal limits     EKG     RADIOLOGY    No results found.  PROCEDURES:  Critical Care performed: No  Procedures   MEDICATIONS ORDERED IN ED: Medications  predniSONE (DELTASONE) tablet 60 mg (has no administration in time range)  acetaminophen (TYLENOL) tablet 650 mg (has no administration in time range)  ibuprofen (ADVIL) tablet 600 mg (has no administration in time range)  lidocaine (XYLOCAINE) 2 % viscous mouth solution 15 mL (has no administration in time range)     IMPRESSION / MDM / ASSESSMENT AND PLAN / ED COURSE  I reviewed the triage vital signs and the nursing notes.                                 Differential diagnosis includes, but is not limited to, viral illness, COVID, flu, bronchitis, pneumonia   Patient's presentation is most  consistent with acute presentation with potential threat to life or bodily function.   Patient's diagnosis is consistent with COVID.  Patient presents emergency department with flulike/COVID-like symptoms.  Was positive for COVID on testing.  Patient loss and control medications at home.  No indication for further workup.  Follow-up primary care as needed..  Patient is given ED precautions to return to the ED for any worsening or new symptoms.     FINAL CLINICAL IMPRESSION(S) / ED DIAGNOSES   Final diagnoses:  COVID-19     Rx / DC Orders   ED Discharge Orders          Ordered    lidocaine (XYLOCAINE) 2 % solution  Every 4 hours PRN        06/15/23 2116    predniSONE (DELTASONE) 50 MG tablet  Daily with breakfast        06/15/23 2116    brompheniramine-pseudoephedrine-DM 30-2-10 MG/5ML syrup  4 times daily PRN        06/15/23 2116    benzonatate (TESSALON PERLES) 100 MG capsule  3 times daily PRN        06/15/23 2116             Note:   This document was prepared using Dragon voice recognition software and may include unintentional dictation errors.   Lanette Hampshire 06/15/23 2116    Georga Hacking, MD 06/17/23 [phone]

## 2023-06-15 NOTE — ED Triage Notes (Signed)
Pt to ed from home via POV for fever and headache and bodyaches. Pt is CAOx4, in no acute distress and ambulatory in triage.

## 2023-06-18 ENCOUNTER — Encounter: Payer: Self-pay | Admitting: Emergency Medicine

## 2023-06-18 ENCOUNTER — Emergency Department: Payer: Self-pay

## 2023-06-18 ENCOUNTER — Other Ambulatory Visit: Payer: Self-pay

## 2023-06-18 ENCOUNTER — Emergency Department
Admission: EM | Admit: 2023-06-18 | Discharge: 2023-06-18 | Disposition: A | Discharge status: Home / Self Care | Payer: Self-pay | Attending: Emergency Medicine | Admitting: Emergency Medicine

## 2023-06-18 DIAGNOSIS — U071 COVID-19: Secondary | ICD-10-CM

## 2023-06-18 DIAGNOSIS — R091 Pleurisy: Secondary | ICD-10-CM

## 2023-06-18 DIAGNOSIS — N189 Chronic kidney disease, unspecified: Secondary | ICD-10-CM | POA: Insufficient documentation

## 2023-06-18 DIAGNOSIS — I129 Hypertensive chronic kidney disease with stage 1 through stage 4 chronic kidney disease, or unspecified chronic kidney disease: Secondary | ICD-10-CM | POA: Insufficient documentation

## 2023-06-18 DIAGNOSIS — R079 Chest pain, unspecified: Secondary | ICD-10-CM | POA: Insufficient documentation

## 2023-06-18 LAB — CBC
HCT: 36.6 % (ref 36.0–46.0)
Hemoglobin: 11.8 g/dL — ABNORMAL LOW (ref 12.0–15.0)
MCH: 27.6 pg (ref 26.0–34.0)
MCHC: 32.2 g/dL (ref 30.0–36.0)
MCV: 85.7 fL (ref 80.0–100.0)
Platelets: 351 K/uL (ref 150–400)
RBC: 4.27 MIL/uL (ref 3.87–5.11)
RDW: 14.5 % (ref 11.5–15.5)
WBC: 10.7 K/uL — ABNORMAL HIGH (ref 4.0–10.5)
nRBC: 0 % (ref 0.0–0.2)

## 2023-06-18 LAB — TROPONIN I (HIGH SENSITIVITY)
Troponin I (High Sensitivity): 5 ng/L (ref <18)
Troponin I (High Sensitivity): 5 ng/L (ref <18)

## 2023-06-18 LAB — BASIC METABOLIC PANEL
Anion gap: 9 (ref 5–15)
BUN: 11 mg/dL (ref 6–20)
CO2: 23 mmol/L (ref 22–32)
Calcium: 8.9 mg/dL (ref 8.9–10.3)
Chloride: 102 mmol/L (ref 98–111)
Creatinine, Ser: 0.69 mg/dL (ref 0.44–1.00)
GFR, Estimated: >60 mL/min (ref >60)
Glucose, Bld: 96 mg/dL (ref 70–99)
Potassium: 3.1 mmol/L — ABNORMAL LOW (ref 3.5–5.1)
Sodium: 134 mmol/L — ABNORMAL LOW (ref 135–145)

## 2023-06-18 LAB — HCG, QUANTITATIVE, PREGNANCY: hCG, Beta Chain, Quant, S: <1 m[IU]/mL

## 2023-06-18 MED ORDER — KETOROLAC TROMETHAMINE 15 MG/ML IJ SOLN
15.0000 mg | Freq: Once | INTRAMUSCULAR | Status: AC
  Administered 2023-06-18: 15 mg via INTRAVENOUS
  Filled 2023-06-18: qty 1

## 2023-06-18 MED ORDER — PANTOPRAZOLE SODIUM 40 MG IV SOLR
40.0000 mg | Freq: Once | INTRAVENOUS | Status: AC
  Administered 2023-06-18: 40 mg via INTRAVENOUS
  Filled 2023-06-18: qty 10

## 2023-06-18 MED ORDER — SODIUM CHLORIDE 0.9 % IV BOLUS
1000.0000 mL | Freq: Once | INTRAVENOUS | Status: AC
  Administered 2023-06-18: 1000 mL via INTRAVENOUS

## 2023-06-18 MED ORDER — IOHEXOL 350 MG/ML SOLN
75.0000 mL | Freq: Once | INTRAVENOUS | Status: AC | PRN
  Administered 2023-06-18: 75 mL via INTRAVENOUS

## 2023-06-18 NOTE — ED Triage Notes (Signed)
Patient ambulatory to triage with steady gait, without difficulty or distress noted; pt reports being seen and dx COVID 2 days ago; c/o mid CP, SHOB and HA

## 2023-06-18 NOTE — ED Provider Notes (Signed)
[facility] Provider Note    Event Date/Time   First MD Initiated Contact with Patient 06/18/23 [phone]     (approximate)   History   Chief Complaint: Chest Pain   HPI  Sherri Vasquez is a 32 y.o. female with a history of hypertension, CKD who comes ED complaining of sharp pleuritic chest pain in the center of the chest that started yesterday associated with shortness of breath.  She started having bodyaches fatigue chills headache 3 or 4 days ago, was seen in the ED 2 days ago and diagnosed with COVID.  However since that time her symptoms have escalated.  Denies history of DVT or PE.  Denies fever or productive cough.     Physical Exam   Triage Vital Signs: ED Triage Vitals [06/18/23 0350]  Enc Vitals Group     BP (!) 141/109     Pulse Rate (!) 124     Resp 18     Temp 98.3 F (36.8 C)     Temp Source Oral     SpO2 98 %     Weight 134 lb (60.8 kg)     Height 5\' 1"  (1.549 m)     Head Circumference      Peak Flow      Pain Score 9     Pain Loc      Pain Edu?      Excl. in GC?     Most recent vital signs: Vitals:   06/18/23 0610 06/18/23 1005  BP: (!) 147/101 (!) 124/97  Pulse: 88 65  Resp: 18 19  Temp: 98.4 F (36.9 C)   SpO2: 100% 100%    General: Awake, no distress.  CV:  Good peripheral perfusion.  Regular rate rhythm, heart rate 85 Resp:  Normal effort.  Clear to auscultation bilaterally Abd:  No distention.  Soft, nontender Other:  Moist oral mucosa   ED Results / Procedures / Treatments   Labs (all labs ordered are listed, but only abnormal results are displayed) Labs Reviewed  CBC - Abnormal; Notable for the following components:      Result Value   WBC 10.7 (*)    Hemoglobin 11.8 (*)    All other components within normal limits  BASIC METABOLIC PANEL - Abnormal; Notable for the following components:   Sodium 134 (*)    Potassium 3.1 (*)    All other components within normal limits  HCG, QUANTITATIVE,  PREGNANCY  TROPONIN I (HIGH SENSITIVITY)  TROPONIN I (HIGH SENSITIVITY)     EKG Interpreted by me Normal sinus rhythm, rate of 94.  Normal axis, normal intervals.  Normal QRS ST segments and T waves.   RADIOLOGY Chest x-ray interpreted by me, appears normal.  Radiology report reviewed.  CT angiogram of the chest interpreted by me, negative for pneumonia.  Radiology report reviewed, negative for PE.   PROCEDURES:  Procedures   MEDICATIONS ORDERED IN ED: Medications  sodium chloride 0.9 % bolus 1,000 mL (0 mLs Intravenous Stopped 06/18/23 1138)  ketorolac (TORADOL) 15 MG/ML injection 15 mg (15 mg Intravenous Given 06/18/23 0950)  pantoprazole (PROTONIX) injection 40 mg (40 mg Intravenous Given 06/18/23 0951)  iohexol (OMNIPAQUE) 350 MG/ML injection 75 mL (75 mLs Intravenous Contrast Given 06/18/23 1011)     IMPRESSION / MDM / ASSESSMENT AND PLAN / ED COURSE  I reviewed the triage vital signs and the nursing notes.  DDx: Pleurisy, costochondritis, pneumonia, pulmonary embolism  Patient's presentation is most consistent  with acute presentation with potential threat to life or bodily function.  Patient presents with sharp pleuritic chest pain in the setting of recent COVID diagnosis.  Nontoxic, but with initial tachycardia and quality of the symptoms, concern for PE.  Labs chest x-ray all unremarkable.  CT angiogram negative.  Stable for discharge to continue NSAIDs.       FINAL CLINICAL IMPRESSION(S) / ED DIAGNOSES   Final diagnoses:  Nonspecific chest pain     Rx / DC Orders   ED Discharge Orders     None        Note:  This document was prepared using Dragon voice recognition software and may include unintentional dictation errors.   Sharman Cheek, MD 06/18/23 1143

## 2023-06-18 NOTE — Discharge Instructions (Signed)
Your lab tests and CT scan of the chest today were all okay.  Continue taking anti-inflammatory medicine such as ibuprofen 600 mg every 6 hours to help control the pain related to your COVID infection.

## 2023-10-03 ENCOUNTER — Emergency Department: Payer: Self-pay

## 2023-10-03 ENCOUNTER — Emergency Department
Admission: EM | Admit: 2023-10-03 | Discharge: 2023-10-03 | Disposition: A | Discharge status: Home / Self Care | Payer: Self-pay | Attending: Emergency Medicine | Admitting: Emergency Medicine

## 2023-10-03 ENCOUNTER — Other Ambulatory Visit: Payer: Self-pay

## 2023-10-03 ENCOUNTER — Encounter: Payer: Self-pay | Admitting: Emergency Medicine

## 2023-10-03 DIAGNOSIS — G43009 Migraine without aura, not intractable, without status migrainosus: Secondary | ICD-10-CM | POA: Insufficient documentation

## 2023-10-03 LAB — BASIC METABOLIC PANEL
Anion gap: 11 (ref 5–15)
BUN: 11 mg/dL (ref 6–20)
CO2: 24 mmol/L (ref 22–32)
Calcium: 9 mg/dL (ref 8.9–10.3)
Chloride: 102 mmol/L (ref 98–111)
Creatinine, Ser: 0.74 mg/dL (ref 0.44–1.00)
GFR, Estimated: >60 mL/min (ref >60)
Glucose, Bld: 91 mg/dL (ref 70–99)
Potassium: 3.6 mmol/L (ref 3.5–5.1)
Sodium: 137 mmol/L (ref 135–145)

## 2023-10-03 LAB — CBC WITH DIFFERENTIAL/PLATELET
Abs Immature Granulocytes: 0.02 10*3/uL (ref 0.00–0.07)
Basophils Absolute: 0.1 10*3/uL (ref 0.0–0.1)
Basophils Relative: 1 %
Eosinophils Absolute: 0.1 10*3/uL (ref 0.0–0.5)
Eosinophils Relative: 1 %
HCT: 38.1 % (ref 36.0–46.0)
Hemoglobin: 12 g/dL (ref 12.0–15.0)
Immature Granulocytes: 0 %
Lymphocytes Relative: 34 %
Lymphs Abs: 2.2 10*3/uL (ref 0.7–4.0)
MCH: 27.4 pg (ref 26.0–34.0)
MCHC: 31.5 g/dL (ref 30.0–36.0)
MCV: 87 fL (ref 80.0–100.0)
Monocytes Absolute: 0.5 10*3/uL (ref 0.1–1.0)
Monocytes Relative: 8 %
Neutro Abs: 3.6 10*3/uL (ref 1.7–7.7)
Neutrophils Relative %: 56 %
Platelets: 345 10*3/uL (ref 150–400)
RBC: 4.38 MIL/uL (ref 3.87–5.11)
RDW: 14.1 % (ref 11.5–15.5)
WBC: 6.3 10*3/uL (ref 4.0–10.5)
nRBC: 0 % (ref 0.0–0.2)

## 2023-10-03 MED ORDER — PROCHLORPERAZINE EDISYLATE 10 MG/2ML IJ SOLN
10.0000 mg | Freq: Once | INTRAMUSCULAR | Status: AC
  Administered 2023-10-03: 10 mg via INTRAVENOUS
  Filled 2023-10-03: qty 2

## 2023-10-03 MED ORDER — IOHEXOL 350 MG/ML SOLN
75.0000 mL | Freq: Once | INTRAVENOUS | Status: AC | PRN
  Administered 2023-10-03: 75 mL via INTRAVENOUS

## 2023-10-03 MED ORDER — SODIUM CHLORIDE 0.9 % IV BOLUS
1000.0000 mL | Freq: Once | INTRAVENOUS | Status: AC
  Administered 2023-10-03: 1000 mL via INTRAVENOUS

## 2023-10-03 MED ORDER — CYCLOBENZAPRINE HCL 10 MG PO TABS
5.0000 mg | ORAL_TABLET | Freq: Once | ORAL | Status: AC
  Administered 2023-10-03: 5 mg via ORAL
  Filled 2023-10-03: qty 1

## 2023-10-03 MED ORDER — KETOROLAC TROMETHAMINE 15 MG/ML IJ SOLN
15.0000 mg | Freq: Once | INTRAMUSCULAR | Status: AC
  Administered 2023-10-03: 15 mg via INTRAMUSCULAR
  Filled 2023-10-03: qty 1

## 2023-10-03 MED ORDER — DEXAMETHASONE SODIUM PHOSPHATE 10 MG/ML IJ SOLN
10.0000 mg | Freq: Once | INTRAMUSCULAR | Status: AC
  Administered 2023-10-03: 10 mg via INTRAVENOUS
  Filled 2023-10-03: qty 1

## 2023-10-03 MED ORDER — ONDANSETRON 4 MG PO TBDP
4.0000 mg | ORAL_TABLET | Freq: Once | ORAL | Status: AC
  Administered 2023-10-03: 4 mg via ORAL
  Filled 2023-10-03: qty 1

## 2023-10-03 MED ORDER — DIPHENHYDRAMINE HCL 50 MG/ML IJ SOLN
25.0000 mg | Freq: Once | INTRAMUSCULAR | Status: AC
  Administered 2023-10-03: 25 mg via INTRAVENOUS
  Filled 2023-10-03: qty 1

## 2023-10-03 MED ORDER — ACETAMINOPHEN 500 MG PO TABS
1000.0000 mg | ORAL_TABLET | Freq: Once | ORAL | Status: AC
  Administered 2023-10-03: 1000 mg via ORAL
  Filled 2023-10-03: qty 2

## 2023-10-03 NOTE — ED Provider Notes (Signed)
[facility] Emergency Department Provider Note     Event Date/Time   First MD Initiated Contact with Patient 10/03/23 1216     (approximate)   History   Headache (Reports headache that radiates to mid neck and down right arm x 3 days, dizziness and nausea started today, blurred vision started today.  )   HPI  Sherri Vasquez is a 32 y.o. female presents to the ED with complaint of headache x 3 days.  Localized to the base of skull and bilateral temporal area with radiation down her neck and right arm.  Patient describes pain as burning.  Patient reports taking Tylenol with no relief.  Associated symptoms include dizziness nausea and blurred vision when waking up today.  Denies fever, sick contacts and trauma and injury.  Denies chest pain and shortness of breath.  Patient endorses history of headaches but nothing to this severity.     Physical Exam   Triage Vital Signs: ED Triage Vitals  Encounter Vitals Group     BP 10/03/23 0958 (!) 140/92     Systolic BP Percentile --      Diastolic BP Percentile --      Pulse Rate 10/03/23 0958 70     Resp 10/03/23 0958 18     Temp 10/03/23 0958 98.2 F (36.8 C)     Temp Source 10/03/23 0958 Oral     SpO2 10/03/23 0958 100 %     Weight 10/03/23 1000 137 lb (62.1 kg)     Height 10/03/23 1000 5\' 1"  (1.549 m)     Head Circumference --      Peak Flow --      Pain Score 10/03/23 1004 9     Pain Loc --      Pain Education --      Exclude from Growth Chart --     Most recent vital signs: Vitals:   10/03/23 1404 10/03/23 1813  BP: 138/88 131/71  Pulse: 70 77  Resp: 18 17  Temp: 98 F (36.7 C) 98.2 F (36.8 C)  SpO2: 100% 99%    General: Alert and oriented. INAD.  Skin:  Warm, dry and intact. No rashes or lesions noted.     Head:  NCAT.  Eyes:  PERRLA. EOMI.  Nose:   Mucosa is moist. No rhinorrhea. Neck:   No cervical spine tenderness to palpation. Full ROM with pain.  Paraspinal muscle  tenderness, no nuchal rigidity.  No carotid bruits on auscultation. CV:  Good peripheral perfusion. RRR.  RESP:  Normal effort. LCTAB. No retractions.  ABD:  No distention. Soft, Non tender.  BACK:  Spinous process is midline without deformity or tenderness. MSK:   Full ROM in all joints. No swelling, deformity or tenderness.  NEURO: Cranial nerves II-XII intact. No focal deficits. Sensation and motor function intact. 5/5 muscle strength of UE & LE. Gait is steady.    ED Results / Procedures / Treatments   Labs (all labs ordered are listed, but only abnormal results are displayed) Labs Reviewed  CBC WITH DIFFERENTIAL/PLATELET  BASIC METABOLIC PANEL    RADIOLOGY  I personally viewed and evaluated these images as part of my medical decision making, as well as reviewing the written report by the radiologist.  ED Provider Interpretation: CTA is unremarkable.  CT venogram appears normal.  Pending final read from radiology.  CT Angio Head Neck W WO CM  Result Date: 10/03/2023 CLINICAL DATA:  Initial evaluation for acute  vertigo, headache, dizziness, blurry vision. EXAM: CT ANGIOGRAPHY HEAD AND NECK CT VENOGRAM HEAD TECHNIQUE: Multidetector CT imaging of the head and neck was performed using the standard protocol during bolus administration of intravenous contrast. Multiplanar CT image reconstructions and MIPs were obtained to evaluate the vascular anatomy. Carotid stenosis measurements (when applicable) are obtained utilizing NASCET criteria, using the distal internal carotid diameter as the denominator. RADIATION DOSE REDUCTION: This exam was performed according to the departmental dose-optimization program which includes automated exposure control, adjustment of the mA and/or kV according to patient size and/or use of iterative reconstruction technique. CONTRAST:  75mL OMNIPAQUE IOHEXOL 350 MG/ML SOLN COMPARISON:  None Available. FINDINGS: CT HEAD FINDINGS Brain: Cerebral volume within normal  limits. No acute intracranial hemorrhage. No acute large vessel territory infarct. No mass lesion, midline shift or mass effect. No hydrocephalus or extra-axial fluid collection. 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 Pituitary gland within normal limits. No empty sella. Vascular: No abnormal hyperdense vessel. Skull: Scalp soft tissues and calvarium within normal limits. Sinuses/Orbits: Globes and orbital soft tissues within normal limits. Paranasal sinuses are clear. No mastoid effusion. Other: None. Review of the MIP images confirms the above findings CTA NECK FINDINGS Aortic arch: Standard branching. Imaged portion shows no evidence of aneurysm or dissection. No significant stenosis of the major arch vessel origins. Right carotid system: No evidence of dissection, stenosis (50% or greater), or occlusion. Left carotid system: No evidence of dissection, stenosis (50% or greater), or occlusion. Vertebral arteries: No evidence of dissection, stenosis (50% or greater), or occlusion. Skeleton: No discrete or worrisome osseous lesions. Other neck: No other acute finding. Upper chest: No other acute finding. Review of the MIP images confirms the above findings CTA HEAD FINDINGS Anterior circulation: Both internal carotid arteries are patent to the termini without stenosis or other abnormality. A1 segments patent bilaterally. Normal anterior communicating artery complex. Both ACAs widely patent, with the right being strongly dominant. No M1 stenosis or occlusion. Distal MCA branches perfused and symmetric. Posterior circulation: Both V4 segments patent without stenosis. Left vertebral artery dominant. Both PICA patent. Basilar patent without stenosis. Superior cerebellar and posterior cerebral arteries widely patent bilaterally. Venous sinuses: Dedicated venogram images were performed. Normal enhancement seen throughout the superior sagittal sinus to the torcula. Transverse and sigmoid sinuses are patent  as are the jugular bulbs and visualized internal jugular veins. Left transverse sinus dominant. Straight sinus, vein of Galen, and internal cerebral veins are patent. No abnormality about the cavernous sinus. No appreciable dural venous sinus stenosis. No appreciable cortical vein thrombosis. Anatomic variants: As above.  No intracranial aneurysm. Review of the MIP images confirms the above findings IMPRESSION: 1. Normal CTA of the head and neck. No large vessel occlusion, hemodynamically significant stenosis, or other acute vascular abnormality. No aneurysm. 2. Normal CT venogram. No evidence for dural sinus thrombosis. 3. No other acute intracranial abnormality. Electronically Signed   By: Rise Mu M.D.   On: 10/03/2023 18:29   CT VENOGRAM HEAD  Result Date: 10/03/2023 CLINICAL DATA:  Initial evaluation for acute vertigo, headache, dizziness, blurry vision. EXAM: CT ANGIOGRAPHY HEAD AND NECK CT VENOGRAM HEAD TECHNIQUE: Multidetector CT imaging of the head and neck was performed using the standard protocol during bolus administration of intravenous contrast. Multiplanar CT image reconstructions and MIPs were obtained to evaluate the vascular anatomy. Carotid stenosis measurements (when applicable) are obtained utilizing NASCET criteria, using the distal internal carotid diameter as the denominator. RADIATION DOSE REDUCTION: This exam was performed  according to the departmental dose-optimization program which includes automated exposure control, adjustment of the mA and/or kV according to patient size and/or use of iterative reconstruction technique. CONTRAST:  75mL OMNIPAQUE IOHEXOL 350 MG/ML SOLN COMPARISON:  None Available. FINDINGS: CT HEAD FINDINGS Brain: Cerebral volume within normal limits. No acute intracranial hemorrhage. No acute large vessel territory infarct. No mass lesion, midline shift or mass effect. No hydrocephalus or extra-axial fluid collection. Mega cisterna magna versus retro  cerebellar cyst noted. Pituitary gland within normal limits. No empty sella. Vascular: No abnormal hyperdense vessel. Skull: Scalp soft tissues and calvarium within normal limits. Sinuses/Orbits: Globes and orbital soft tissues within normal limits. Paranasal sinuses are clear. No mastoid effusion. Other: None. Review of the MIP images confirms the above findings CTA NECK FINDINGS Aortic arch: Standard branching. Imaged portion shows no evidence of aneurysm or dissection. No significant stenosis of the major arch vessel origins. Right carotid system: No evidence of dissection, stenosis (50% or greater), or occlusion. Left carotid system: No evidence of dissection, stenosis (50% or greater), or occlusion. Vertebral arteries: No evidence of dissection, stenosis (50% or greater), or occlusion. Skeleton: No discrete or worrisome osseous lesions. Other neck: No other acute finding. Upper chest: No other acute finding. Review of the MIP images confirms the above findings CTA HEAD FINDINGS Anterior circulation: Both internal carotid arteries are patent to the termini without stenosis or other abnormality. A1 segments patent bilaterally. Normal anterior communicating artery complex. Both ACAs widely patent, with the right being strongly dominant. No M1 stenosis or occlusion. Distal MCA branches perfused and symmetric. Posterior circulation: Both V4 segments patent without stenosis. Left vertebral artery dominant. Both PICA patent. Basilar patent without stenosis. Superior cerebellar and posterior cerebral arteries widely patent bilaterally. Venous sinuses: Dedicated venogram images were performed. Normal enhancement seen throughout the superior sagittal sinus to the torcula. Transverse and sigmoid sinuses are patent as are the jugular bulbs and visualized internal jugular veins. Left transverse sinus dominant. Straight sinus, vein of Galen, and internal cerebral veins are patent. No abnormality about the cavernous sinus. No  appreciable dural venous sinus stenosis. No appreciable cortical vein thrombosis. Anatomic variants: As above.  No intracranial aneurysm. Review of the MIP images confirms the above findings IMPRESSION: 1. Normal CTA of the head and neck. No large vessel occlusion, hemodynamically significant stenosis, or other acute vascular abnormality. No aneurysm. 2. Normal CT venogram. No evidence for dural sinus thrombosis. 3. No other acute intracranial abnormality. Electronically Signed   By: Rise Mu M.D.   On: 10/03/2023 18:29    PROCEDURES:  Critical Care performed: No  Procedures   MEDICATIONS ORDERED IN ED: Medications  ketorolac (TORADOL) 15 MG/ML injection 15 mg (15 mg Intramuscular Given 10/03/23 1324)  ondansetron (ZOFRAN-ODT) disintegrating tablet 4 mg (4 mg Oral Given 10/03/23 1322)  acetaminophen (TYLENOL) tablet 1,000 mg (1,000 mg Oral Given 10/03/23 1323)  cyclobenzaprine (FLEXERIL) tablet 5 mg (5 mg Oral Given 10/03/23 1323)  sodium chloride 0.9 % bolus 1,000 mL (0 mLs Intravenous Stopped 10/03/23 1634)  dexamethasone (DECADRON) injection 10 mg (10 mg Intravenous Given 10/03/23 1458)  prochlorperazine (COMPAZINE) injection 10 mg (10 mg Intravenous Given 10/03/23 1638)  diphenhydrAMINE (BENADRYL) injection 25 mg (25 mg Intravenous Given 10/03/23 1638)  iohexol (OMNIPAQUE) 350 MG/ML injection 75 mL (75 mLs Intravenous Contrast Given 10/03/23 1709)     IMPRESSION / MDM / ASSESSMENT AND PLAN / ED COURSE  I reviewed the triage vital signs and the nursing notes.  Clinical Course as of 10/03/23 1932  Sherri Vasquez Oct 03, 2023  1602 Patient still reports severe pain with ED treatment.  [MH]  1914 Patient reporting improvement in headache and pain.  Patient is in stable addition for discharge home. [MH]    Clinical Course User Index [MH] Kern Reap A, PA-C    32 y.o. female presents to the emergency department for evaluation and treatment of acute  headache with neck pain. See HPI for further details.   Differential diagnosis includes, but is not limited to migraine, tension headache, ICH highly unlikely, meningitis less likely but considered, carotid dissection highly unlikely considered.  Patient's presentation is most consistent with acute presentation with potential threat to life or bodily function.  Patient is alert and oriented.  She is afebrile and hemodynamically stable.  Physical exam findings are as stated above.  CBC and BMP are reassuring.  Pain management with migraine cocktail initially did not improve pain.  IV fluids administered with Decadron.  On reevaluation patients pain is unchanged.  Patient reporting 9 out of 10 pain around base of neck.   Shared visit with Dr. Augustin Schooling who assessed and evaluated the patient.  CT angio head/neck and CT venogram obtained.  Pain management with Compazine and Benadryl administered.  Imaging results are reassuring ruling out sinus thrombosis and carotid stenosis.  Patient is reporting movement and symptoms at this time.  Given reassuring imaging patient is in stable condition for discharge home and close follow-up with neurology.  Patient presentation is clinically consistent with a migraine.  ED precautions discussed.Patient verbalizes understanding. All questions and concerns were addressed during ED visit.     FINAL CLINICAL IMPRESSION(S) / ED DIAGNOSES   Final diagnoses:  Migraine without aura and without status migrainosus, not intractable   Rx / DC Orders   ED Discharge Orders     None      Note:  This document was prepared using Dragon voice recognition software and may include unintentional dictation errors.    Romeo Apple, Edie Vallandingham A, PA-C 10/03/23 1939    Corena Herter, MD 10/03/23 2006

## 2023-10-03 NOTE — ED Provider Notes (Signed)
Shared visit  Patient presents to the emergency department with a headache.  States that she had a sudden onset of headache 3 days ago whenever she was upset and angry.  States that her headache has been worsening since that time.  States that the pain radiates from her neck up to her head.  No association with change in vision, slurring of speech, trouble swallowing, extremity numbness or weakness.  No fever or chills.  No altered mental status.  On exam no meningismus.  Had a nonfocal neurologic exam.  Pupils are equal and reactive with no nystagmus.  Clinical picture with low suspicion for acute meningitis.  No meningismus, fever on exam.  No positional change of her headaches.  Given initial treatment of migraine medication.  On reevaluation continued to have headache.  Added on CTA to evaluate for subarachnoid hemorrhage and CTV to evaluate for cerebral venous thrombosis.  Given another round of IV migraine medication  CTA and CTV without acute abnormalities.  On reevaluation patient states that her headache and her neck symptoms have significantly improved and she is no longer having pain.  Most likely with primary headache.  Discussed return precautions for any return of symptoms.     Corena Herter, MD 10/03/23 2006

## 2023-10-03 NOTE — ED Notes (Signed)
See triage note Presents with neck pain for couple of days  along with headache   No fever  Also states that pain is moving into right shoulder

## 2023-10-03 NOTE — ED Notes (Signed)
Discharge instructions reviewed with patient. Patient questions answered and opportunity for education reviewed. Patient voices understanding of discharge instructions with no further questions. Patient ambulatory with steady gait to lobby.  

## 2023-10-03 NOTE — Discharge Instructions (Addendum)
You were evaluated in the ED for a headache.  Your CT angiography and CT venogram were normal.  Your labs are reassuring.  Is encouraged to follow-up with neurosurgery if symptoms do not improve.  In the interim take Tylenol as needed.  Follow-up with primary care.  A list has been provided for you below to establish care.  Please go to the following website to schedule new (and existing) patient appointments:   [email or web address]   The following is a list of primary care offices in the area who are accepting new patients at this time.  Please reach out to one of them directly and let them know you would like to schedule an appointment to follow up on an Emergency Department visit, and/or to establish a new primary care provider (PCP).  There are likely other primary care clinics in the are who are accepting new patients, but this is an excellent place to start:  [facility] Lead physician: Dr Shirlee Latch [address] [place], [place] [address] [phone]  [facility] Lead Physician: Dr Alba Cory [address], [place], [place] [address] [phone]  [facility]  Lead Physician: Dr Olevia Perches [address] [place], [place] [address] [phone]  [facility] Lead Physician: Dr Sofie Hartigan [address], [place], [place] [address] [phone]  [facility] at [facility] Lead Physician: Dr Bari Edward [address] [place], [place], [place] [address] [phone]

## 2023-10-03 NOTE — ED Triage Notes (Signed)
Reports headache that radiates to mid neck and down right arm x 3 days, dizziness and nausea started today, blurred vision started today.

## 2024-08-16 ENCOUNTER — Emergency Department: Payer: Self-pay

## 2024-08-16 ENCOUNTER — Other Ambulatory Visit: Payer: Self-pay

## 2024-08-16 DIAGNOSIS — R42 Dizziness and giddiness: Secondary | ICD-10-CM | POA: Insufficient documentation

## 2024-08-16 DIAGNOSIS — R079 Chest pain, unspecified: Secondary | ICD-10-CM | POA: Insufficient documentation

## 2024-08-16 DIAGNOSIS — R519 Headache, unspecified: Secondary | ICD-10-CM | POA: Insufficient documentation

## 2024-08-16 DIAGNOSIS — E876 Hypokalemia: Secondary | ICD-10-CM | POA: Insufficient documentation

## 2024-08-16 LAB — BASIC METABOLIC PANEL WITH GFR
Anion gap: 9 (ref 5–15)
BUN: 10 mg/dL (ref 6–20)
CO2: 27 mmol/L (ref 22–32)
Calcium: 9.3 mg/dL (ref 8.9–10.3)
Chloride: 105 mmol/L (ref 98–111)
Creatinine, Ser: 0.75 mg/dL (ref 0.44–1.00)
GFR, Estimated: >60 mL/min (ref >60)
Glucose, Bld: 111 mg/dL — ABNORMAL HIGH (ref 70–99)
Potassium: 3.4 mmol/L — ABNORMAL LOW (ref 3.5–5.1)
Sodium: 141 mmol/L (ref 135–145)

## 2024-08-16 LAB — RESP PANEL BY RT-PCR (RSV, FLU A&B, COVID)  RVPGX2
Influenza A by PCR: NEGATIVE
Influenza B by PCR: NEGATIVE
Resp Syncytial Virus by PCR: NEGATIVE
SARS Coronavirus 2 by RT PCR: NEGATIVE

## 2024-08-16 LAB — CBC
HCT: 36.3 % (ref 36.0–46.0)
Hemoglobin: 12.1 g/dL (ref 12.0–15.0)
MCH: 29.2 pg (ref 26.0–34.0)
MCHC: 33.3 g/dL (ref 30.0–36.0)
MCV: 87.7 fL (ref 80.0–100.0)
Platelets: 362 K/uL (ref 150–400)
RBC: 4.14 MIL/uL (ref 3.87–5.11)
RDW: 13 % (ref 11.5–15.5)
WBC: 8.3 K/uL (ref 4.0–10.5)
nRBC: 0 % (ref 0.0–0.2)

## 2024-08-16 LAB — TROPONIN I (HIGH SENSITIVITY): Troponin I (High Sensitivity): 3 ng/L (ref <18)

## 2024-08-16 NOTE — ED Triage Notes (Signed)
 Pt ambulatory to triage.  Pt has dizziness, sob. And headache.  Pt had covid 2 weeks ago.  Pt reports chest pain also.  Pt alert  speech clear.

## 2024-08-17 ENCOUNTER — Emergency Department
Admission: EM | Admit: 2024-08-17 | Discharge: 2024-08-17 | Disposition: A | Discharge status: Home / Self Care | Payer: Self-pay | Attending: Emergency Medicine | Admitting: Emergency Medicine

## 2024-08-17 DIAGNOSIS — R519 Headache, unspecified: Secondary | ICD-10-CM

## 2024-08-17 DIAGNOSIS — R079 Chest pain, unspecified: Secondary | ICD-10-CM

## 2024-08-17 DIAGNOSIS — R42 Dizziness and giddiness: Secondary | ICD-10-CM

## 2024-08-17 LAB — URINALYSIS, ROUTINE W REFLEX MICROSCOPIC
Bilirubin Urine: NEGATIVE
Glucose, UA: NEGATIVE mg/dL
Hgb urine dipstick: NEGATIVE
Ketones, ur: NEGATIVE mg/dL
Leukocytes,Ua: NEGATIVE
Nitrite: NEGATIVE
Protein, ur: NEGATIVE mg/dL
Specific Gravity, Urine: 1.016 (ref 1.005–1.030)
pH: 6 (ref 5.0–8.0)

## 2024-08-17 LAB — PREGNANCY, URINE: Preg Test, Ur: NEGATIVE

## 2024-08-17 MED ORDER — KETOROLAC TROMETHAMINE 15 MG/ML IJ SOLN
15.0000 mg | Freq: Once | INTRAMUSCULAR | Status: AC
  Administered 2024-08-17: 15 mg via INTRAVENOUS
  Filled 2024-08-17: qty 1

## 2024-08-17 MED ORDER — MECLIZINE HCL 25 MG PO TABS: 25.0000 mg | ORAL_TABLET | Freq: Once | ORAL | Status: DC

## 2024-08-17 MED ORDER — PROCHLORPERAZINE EDISYLATE 10 MG/2ML IJ SOLN
10.0000 mg | Freq: Once | INTRAMUSCULAR | Status: AC
  Administered 2024-08-17: 10 mg via INTRAVENOUS
  Filled 2024-08-17: qty 2

## 2024-08-17 MED ORDER — SODIUM CHLORIDE 0.9 % IV BOLUS
1000.0000 mL | Freq: Once | INTRAVENOUS | Status: AC
  Administered 2024-08-17: 1000 mL via INTRAVENOUS

## 2024-08-17 MED ORDER — DIPHENHYDRAMINE HCL 50 MG/ML IJ SOLN
25.0000 mg | Freq: Once | INTRAMUSCULAR | Status: AC
  Administered 2024-08-17: 25 mg via INTRAVENOUS
  Filled 2024-08-17: qty 1

## 2024-08-17 NOTE — Discharge Instructions (Signed)
 You were seen in the ER today for evaluation of your dizziness with chest pain and headache.  Your testing here was fortunately reassuring against an emergency cause for your symptoms.  I suspect that you may have some ongoing symptoms from your recent COVID infection.  Follow with the primary care doctor for further evaluation.  Return to the ER for new or worsening symptoms.

## 2024-08-17 NOTE — ED Provider Notes (Signed)
 [facility] Provider Note    Event Date/Time   First MD Initiated Contact with Patient 08/17/24 [phone]     (approximate)   History   Dizziness   HPI  Sherri Vasquez is a 33 year old female presenting to the emergency department for evaluation of headache, body aches, chest pain.  Patient reports she had COVID 2 weeks ago.  More recently took a test that returned negative.  Had primarily upper respiratory symptoms, but does report some decreased appetite during this time.  No vomiting or diarrhea.  Today, had generalized headache, not sudden in onset.  Did develop some lightheadedness and shortness of breath with chest pain.  No fevers.  No syncope.  Does report some dysuria over the past day.     Physical Exam   Triage Vital Signs: ED Triage Vitals  Encounter Vitals Group     BP 08/16/24 2130 (!) 151/100     Girls Systolic BP Percentile --      Girls Diastolic BP Percentile --      Boys Systolic BP Percentile --      Boys Diastolic BP Percentile --      Pulse Rate 08/16/24 2130 90     Resp 08/16/24 2130 18     Temp 08/16/24 2130 98.2 F (36.8 C)     Temp Source 08/16/24 2130 Oral     SpO2 08/16/24 2130 100 %     Weight 08/16/24 2131 135 lb (61.2 kg)     Height 08/16/24 2131 5' 1 (1.549 m)     Head Circumference --      Peak Flow --      Pain Score 08/16/24 2131 7     Pain Loc --      Pain Education --      Exclude from Growth Chart --     Most recent vital signs: Vitals:   08/17/24 0330 08/17/24 0425  BP: 134/88   Pulse: 75   Resp: 17   Temp:  98.4 F (36.9 C)  SpO2: 100%      General: Awake, interactive  CV:  Regular rate, good peripheral perfusion.  Resp:  Unlabored respirations, lungs clear to auscultation Abd:  Nondistended, soft, nontender Neuro:  Alert and oriented, normal extraocular movements, symmetric facial movement, sensation intact over bilateral upper and lower extremities with 5 out of 5 strength.  Normal  finger-to-nose testing.   ED Results / Procedures / Treatments   Labs (all labs ordered are listed, but only abnormal results are displayed) Labs Reviewed  BASIC METABOLIC PANEL WITH GFR - Abnormal; Notable for the following components:      Result Value   Potassium 3.4 (*)    Glucose, Bld 111 (*)    All other components within normal limits  URINALYSIS, ROUTINE W REFLEX MICROSCOPIC - Abnormal; Notable for the following components:   Color, Urine YELLOW (*)    APPearance CLOUDY (*)    All other components within normal limits  RESP PANEL BY RT-PCR (RSV, FLU A&B, COVID)  RVPGX2  CBC  PREGNANCY, URINE  POC URINE PREG, ED  TROPONIN I (HIGH SENSITIVITY)     EKG EKG independently reviewed and interpreted by myself demonstrates:  EKG demonstrates normal sinus rhythm rate of 84, PR 154, QRS 88, QTc 453, no acute ST changes  RADIOLOGY Imaging independently reviewed and interpreted by myself demonstrates:  CXR without focal consolidation  Formal Radiology Read:  DG Chest 2 View Result Date: 08/16/2024 CLINICAL DATA:  sob And headache. Pt had covid 2 weeks ago. Pt reports chest pain also. Pt alert speech clear. EXAM: CHEST - 2 VIEW COMPARISON:  Chest x-Russ Looper 06/18/2023 FINDINGS: The heart and mediastinal contours are within normal limits. No focal consolidation. No pulmonary edema. No pleural effusion. No pneumothorax. No acute osseous abnormality. IMPRESSION: No active cardiopulmonary disease. Electronically Signed   By: Morgane  Naveau M.D.   On: 08/16/2024 22:45    PROCEDURES:  Critical Care performed: No  Procedures   MEDICATIONS ORDERED IN ED: Medications  sodium chloride  0.9 % bolus 1,000 mL (1,000 mLs Intravenous New Bag/Given 08/17/24 0223)  ketorolac  (TORADOL ) 15 MG/ML injection 15 mg (15 mg Intravenous Given 08/17/24 0221)  diphenhydrAMINE  (BENADRYL ) injection 25 mg (25 mg Intravenous Given 08/17/24 0223)  prochlorperazine  (COMPAZINE ) injection 10 mg (10 mg Intravenous  Given 08/17/24 0219)     IMPRESSION / MDM / ASSESSMENT AND PLAN / ED COURSE  I reviewed the triage vital signs and the nursing notes.  Differential diagnosis includes, but is not limited to, sequela from recent COVID-19 infection, superimposed pneumonia, very low suspicion ACS, low risk PE and PERC negative, dehydration, anemia, electrolyte abnormality, very low suspicion SAH with clinical history of headache not different from prior without focal deficits, sudden onset  Patient's presentation is most consistent with acute presentation with potential threat to life or bodily function.  33 year old female presenting with headache with associated dizziness and chest pain.  Stable vitals on presentation.  Labs with reassuring CBC, BMP.  Negative troponin and well over 3 hours of symptoms.  Lowers heart score.  Urinalysis without evidence of infection.  Viral swab negative.  Chest x-Torrie Namba without focal consolidation.  EKG reassuring.  Patient treated symptomatically for her headache with fluids, Benadryl , Toradol , Compazine .  On reevaluation, patient does report improvement in her headache.  She is comfortable discharge home.  Strict return precautions provided.  Patient discharged in stable condition.     FINAL CLINICAL IMPRESSION(S) / ED DIAGNOSES   Final diagnoses:  Acute nonintractable headache, unspecified headache type  Lightheadedness  Nonspecific chest pain     Rx / DC Orders   ED Discharge Orders     None        Note:  This document was prepared using Dragon voice recognition software and may include unintentional dictation errors.   Levander Slate, MD 08/17/24 [phone]

## 2024-10-27 ENCOUNTER — Encounter: Payer: Self-pay | Admitting: Emergency Medicine

## 2024-10-27 ENCOUNTER — Other Ambulatory Visit: Payer: Self-pay

## 2024-10-27 ENCOUNTER — Emergency Department
Admission: EM | Admit: 2024-10-27 | Discharge: 2024-10-27 | Disposition: A | Discharge status: Home / Self Care | Payer: Self-pay | Attending: Emergency Medicine | Admitting: Emergency Medicine

## 2024-10-27 DIAGNOSIS — H00021 Hordeolum internum right upper eyelid: Secondary | ICD-10-CM | POA: Insufficient documentation

## 2024-10-27 DIAGNOSIS — I10 Essential (primary) hypertension: Secondary | ICD-10-CM | POA: Insufficient documentation

## 2024-10-27 DIAGNOSIS — R519 Headache, unspecified: Secondary | ICD-10-CM | POA: Insufficient documentation

## 2024-10-27 MED ORDER — SODIUM CHLORIDE 0.9 % IV BOLUS
500.0000 mL | Freq: Once | INTRAVENOUS | Status: AC
  Administered 2024-10-27: 500 mL via INTRAVENOUS

## 2024-10-27 MED ORDER — DEXAMETHASONE SOD PHOSPHATE PF 10 MG/ML IJ SOLN
10.0000 mg | Freq: Once | INTRAMUSCULAR | Status: AC
  Administered 2024-10-27: 10 mg via INTRAVENOUS

## 2024-10-27 MED ORDER — METOCLOPRAMIDE HCL 5 MG/ML IJ SOLN
10.0000 mg | Freq: Once | INTRAMUSCULAR | Status: AC
Start: 2024-10-27 — End: 2024-10-27
  Administered 2024-10-27: 10 mg via INTRAVENOUS
  Filled 2024-10-27: qty 2

## 2024-10-27 MED ORDER — DIPHENHYDRAMINE HCL 50 MG/ML IJ SOLN
12.5000 mg | Freq: Once | INTRAMUSCULAR | Status: AC
Start: 2024-10-27 — End: 2024-10-27
  Administered 2024-10-27: 12.5 mg via INTRAVENOUS
  Filled 2024-10-27: qty 1

## 2024-10-27 MED ORDER — KETOROLAC TROMETHAMINE 15 MG/ML IJ SOLN
15.0000 mg | Freq: Once | INTRAMUSCULAR | Status: AC
  Administered 2024-10-27: 15 mg via INTRAVENOUS
  Filled 2024-10-27: qty 1

## 2024-10-27 NOTE — ED Triage Notes (Signed)
 Patient to ED via POV for headache x1 week and right eye swelling x2 days. States pain on right side of face with some blurred vision. States this AM pain is going from right side of face down arm.

## 2024-10-27 NOTE — ED Provider Notes (Signed)
 [facility] Provider Note    Event Date/Time   First MD Initiated Contact with Patient 10/27/24 1301     (approximate)   History   Headache and Eye Problem   HPI  Sherri Vasquez is a 33 y.o. female with PMH of hypertension presents for evaluation of headache and right eyelid swelling.  Patient states that the headache has been present over the past week and the right eyelid swelling began 2 days ago.  She describes her headache as being consistent with previous headaches and states it was not sudden onset.  She has not had any vomiting but does endorse some nausea and sensitivity to light.  She notes some pain to the right eyelid with some associated blurry vision.  Has not seen any drainage from this eye.      Physical Exam   Triage Vital Signs: ED Triage Vitals  Encounter Vitals Group     BP 10/27/24 1256 (!) 163/115     Girls Systolic BP Percentile --      Girls Diastolic BP Percentile --      Boys Systolic BP Percentile --      Boys Diastolic BP Percentile --      Pulse Rate 10/27/24 1256 81     Resp 10/27/24 1256 17     Temp 10/27/24 1256 97.9 F (36.6 C)     Temp Source 10/27/24 1256 Oral     SpO2 10/27/24 1256 100 %     Weight 10/27/24 1257 135 lb (61.2 kg)     Height 10/27/24 1257 5' 1 (1.549 m)     Head Circumference --      Peak Flow --      Pain Score 10/27/24 1257 9     Pain Loc --      Pain Education --      Exclude from Growth Chart --     Most recent vital signs: Vitals:   10/27/24 1256 10/27/24 1455  BP: (!) 163/115 127/82  Pulse: 81   Resp: 17   Temp: 97.9 F (36.6 C)   SpO2: 100%    General: Awake, no distress.  CV:  Good peripheral perfusion. RRR. Resp:  Normal effort.  CTAB. Abd:  No distention.  Eye:  No conjunctival injection bilaterally, right eyelid is swollen and erythematous, no drainage noted, no proptosis Other:  PERRL, EOM intact, symmetric facial movements, no pronator drift, no  ataxia.   ED Results / Procedures / Treatments   Labs (all labs ordered are listed, but only abnormal results are displayed) Labs Reviewed - No data to display  PROCEDURES:  Critical Care performed: No  Procedures   MEDICATIONS ORDERED IN ED: Medications  sodium chloride  0.9 % bolus 500 mL (500 mLs Intravenous New Bag/Given 10/27/24 1331)  ketorolac  (TORADOL ) 15 MG/ML injection 15 mg (15 mg Intravenous Given 10/27/24 1332)  metoCLOPramide  (REGLAN ) injection 10 mg (10 mg Intravenous Given 10/27/24 1334)  diphenhydrAMINE  (BENADRYL ) injection 12.5 mg (12.5 mg Intravenous Given 10/27/24 1333)  dexamethasone  (DECADRON ) injection 10 mg (10 mg Intravenous Given 10/27/24 1333)     IMPRESSION / MDM / ASSESSMENT AND PLAN / ED COURSE  I reviewed the triage vital signs and the nursing notes.                             33 year old female presents for evaluation of headache and right eyelid swelling.  Blood pressure is elevated, vital  signs stable otherwise. Patient does appear quite uncomfortable which I believe is the cause of her hypertension.  Differential diagnosis includes, but is not limited to, migraine, tension headache, viral syndrome, chalazion, hordeolum.  Patient's presentation is most consistent with acute, uncomplicated illness.  Patient reports that her headache is consistent with previous headaches and was not sudden in onset so low suspicion for [facility] as a cause. Do not feel that CT scan is indicated. Will treat patient symptomatically and reassess. She does not have a primary care provider so I will provide local resources for this.   Patient's right eyelid is swollen but there is no conjunctival injection. This is consistent with a hordeolum vs chalazion. Will recommend symptomatic treatment using warm compresses and will give ophthalmology follow up. Do not feel antibiotics are indicated as there is no overlying cellulitis.   Clinical Course as of 10/27/24 1456  Wed Oct 27, 2024  1452 Patient reassessed and she reports she is feeling much better after the medications.  Discussed following up with primary care and ophthalmology if the eye gets worse.  Patient did not need a note for work.  She voiced understanding, all questions were answered and she was stable at discharge. [LD]  1455 BP reassessed and it has improved. [LD]    Clinical Course User Index [LD] Cleaster Tinnie LABOR, PA-C     FINAL CLINICAL IMPRESSION(S) / ED DIAGNOSES   Final diagnoses:  Hordeolum internum of right upper eyelid  Acute nonintractable headache, unspecified headache type     Rx / DC Orders   ED Discharge Orders          Ordered    Ambulatory Referral to Primary Care (Establish Care)        10/27/24 1337             Note:  This document was prepared using Dragon voice recognition software and may include unintentional dictation errors.   Cleaster Tinnie LABOR, PA-C 10/27/24 1456    Arlander Charleston, MD 10/27/24 1505

## 2024-10-27 NOTE — Discharge Instructions (Addendum)
 You were treated in the emergency department for a headache and eye lid swelling.  Your eyelid swelling is due to a hordeolum which is a blocked gland in the eyelid. This is best treated with warm compresses a few times a day. This will encourage it to drain on its own and will help with the swelling. Do not wear eye makeup or contacts until this resolves. Please follow up with ophthalmology whose information is attached if you have any worsening eyelid swelling.  I have attached information on primary care providers that you can schedule with.   Return to the ED with any worsening symptoms.
